# Patient Record
Sex: Male | Born: 1985 | Race: White | Hispanic: No | Marital: Single | Smoking: Never smoker
Health system: Southern US, Community
[De-identification: ages and names within clinical notes are randomized; demographics above are authoritative.]

## PROBLEM LIST (undated history)

## (undated) DIAGNOSIS — Z789 Other specified health status: Secondary | ICD-10-CM

## (undated) DIAGNOSIS — F419 Anxiety disorder, unspecified: Secondary | ICD-10-CM

## (undated) DIAGNOSIS — F4310 Post-traumatic stress disorder, unspecified: Secondary | ICD-10-CM

## (undated) DIAGNOSIS — F319 Bipolar disorder, unspecified: Secondary | ICD-10-CM

## (undated) DIAGNOSIS — N189 Chronic kidney disease, unspecified: Secondary | ICD-10-CM

## (undated) DIAGNOSIS — Z0282 Encounter for adoption services: Secondary | ICD-10-CM

## (undated) HISTORY — PX: SHOULDER SURGERY: SHX246

## (undated) HISTORY — PX: TONSILLECTOMY: SUR1361

## (undated) HISTORY — PX: KNEE SURGERY: SHX244

## (undated) HISTORY — PX: EYE SURGERY: SHX253

---

## 2013-06-03 ENCOUNTER — Emergency Department: Payer: Self-pay | Admitting: Emergency Medicine

## 2013-10-24 ENCOUNTER — Emergency Department: Payer: Self-pay | Admitting: Emergency Medicine

## 2014-12-30 ENCOUNTER — Ambulatory Visit
Admission: EM | Admit: 2014-12-30 | Discharge: 2014-12-30 | Disposition: A | Discharge status: Home / Self Care | Payer: BLUE CROSS/BLUE SHIELD | Attending: Family Medicine | Admitting: Family Medicine

## 2014-12-30 ENCOUNTER — Ambulatory Visit: Payer: BLUE CROSS/BLUE SHIELD

## 2014-12-30 DIAGNOSIS — M545 Low back pain, unspecified: Secondary | ICD-10-CM

## 2014-12-30 MED ORDER — KETOROLAC TROMETHAMINE 60 MG/2ML IM SOLN
60.0000 mg | Freq: Once | INTRAMUSCULAR | Status: AC
  Administered 2014-12-30: 60 mg via INTRAMUSCULAR

## 2014-12-30 MED ORDER — TRAMADOL HCL 50 MG PO TABS: 50.0000 mg | ORAL_TABLET | Freq: Three times a day (TID) | ORAL | Status: DC | PRN

## 2014-12-30 MED ORDER — MELOXICAM 15 MG PO TABS: 15.0000 mg | ORAL_TABLET | Freq: Every day | ORAL | Status: DC | PRN

## 2014-12-30 NOTE — ED Notes (Signed)
Pt states "I was moving my mom's furniture and dead lifted a 200lb freezer. On Sunday night I heard a pop and it's been bad pain every since."

## 2014-12-30 NOTE — ED Provider Notes (Signed)
[facility] Emergency Department Provider Note  ____________________________________________  Time seen: Approximately 2:46 PM  I have reviewed the triage vital signs and the nursing notes.   HISTORY  Chief Complaint Back Pain   HPI Thomas Peters is a 29 y.o. male presents for complaints of low back pain. Patient reports back pain has been present since Sunday. Patient reports that Sunday evening he was helping move his mother's furniture. Patient states that he had to lift a 200 pound freezer up off of the ground so they could get the dolly underneath the freezer. Patient states that he bent down to lift the freezer and states as soon as he started to the lift he started to feel pain in his low back. Patient states the pain is been present since. Denies fall or direct trauma. Denies other pain or injury. Reports continues ambulate well however with pain primarily with movement. States that if he lies down and sits still the pain greatly improved however with movement the pain returns. Patient states pain currently is 5 out of 10 however 8 out of 10 with movement. States bending and twisting pain hurts.   Patient denies numbness or tingling sensation. Denies urinary or bowel retention or incontinence. Denies pain radiation. Denies pain radiation into the legs. Reports continues to ambulate well. Denies recent sickness. Denies neck or upper back pain. Denies abdominal pain, chest pain, shortness of breath, fever, nausea, vomiting, diarrhea or constipation. Patient states that he has a primary care appointment for the same tomorrow. States his primary care physician is Dr[facility]. States however he has to frequently move around at work and his pain was interfering his job and his boss encouraged him to be seen today.States does not have to lift objects at work but does walk and move frequently.    History reviewed. No pertinent past medical history.   right rotator cuff injury.  There are no active problems to display for this patient.   Past Surgical History  Procedure Laterality Date  . Knee surgery    . Eye surgery      Current Outpatient Rx  Name  Route  Sig  Dispense  Refill  .           Marland Kitchen.             Allergies Review of patient's allergies indicates no known allergies.  No family history on file. Reports unknown family history.  Reports adopted at 904 months old.    Social History Social History  Substance Use Topics  . Smoking status: Never Smoker   . Smokeless tobacco: None  . Alcohol Use: No    Review of Systems Constitutional: No fever/chills Eyes: No visual changes. ENT: No sore throat. Cardiovascular: Denies chest pain. Respiratory: Denies shortness of breath. Gastrointestinal: No abdominal pain.  No nausea, no vomiting.  No diarrhea.  No constipation. Genitourinary: Negative for dysuria. Musculoskeletal: positive for back pain. Skin: Negative for rash. Neurological: Negative for headaches, focal weakness or numbness.  10-point ROS otherwise negative.  ____________________________________________   PHYSICAL EXAM:  VITAL SIGNS: ED Triage Vitals  Enc Vitals Group     BP 12/30/14 1400 132/79 mmHg     Pulse Rate 12/30/14 1400 80     Resp 12/30/14 1400 18     Temp 12/30/14 1400 97.5 F (36.4 C)     Temp Source 12/30/14 1400 Tympanic     SpO2 12/30/14 1400 99 %     Weight  12/30/14 1400 287 lb (130.182 kg)     Height 12/30/14 1400  (1.778 m)     Head Cir --      Peak Flow --      Pain Score 12/30/14 1359 8     Pain Loc --      Pain Edu? --      Excl. in GC? --    Constitutional: Alert and oriented. Well appearing and in no acute distress. Eyes: Conjunctivae are normal. PERRL. EOMI. Head: Atraumatic.  Nose: No congestion/rhinnorhea.  Mouth/Throat: Mucous membranes are moist.  Oropharynx non-erythematous. Neck: No stridor.  No cervical spine tenderness to  palpation. Hematological/Lymphatic/Immunilogical: No cervical lymphadenopathy. Cardiovascular: Normal rate, regular rhythm. Grossly normal heart sounds.  Good peripheral circulation. Respiratory: Normal respiratory effort.  No retractions. Lungs CTAB. Gastrointestinal: Soft and nontender. No distention. Normal Bowel sounds. No CVA tenderness. Musculoskeletal: No lower or upper extremity tenderness nor edema.  No joint effusions. Bilateral pedal pulses equal and easily palpated.  No cervical or thoracic TTP.  Lower lumbar mild TTP midline and mild paralumbar TTP. Steady gait. No mass, bulging, ecchymosis or erythema present. Right straight leg test negative, mild pain with left straight leg test at approximately 45 degrees. No saddle anesthesia. Changes positions from lying to standing quickly without distress or difficulty. Steady gait. Lumbar pain with flexion and right and left rotation. Strong plantar and dorsiflexion.  Neurologic:  Normal speech and language. No gross focal neurologic deficits are appreciated. No gait instability. Skin:  Skin is warm, dry and intact. No rash noted. Psychiatric: Mood and affect are normal. Speech and behavior are normal.  ____________________________________________   LABS (all labs ordered are listed, but only abnormal results are displayed)  Labs Reviewed - No data to display  RADIOLOGY  EXAM: LUMBAR SPINE - COMPLETE 4+ VIEW  COMPARISON: October 24, 2013  FINDINGS: Frontal, lateral, spot lumbosacral lateral, and bilateral oblique views were obtained. There are 5 non-rib-bearing lumbar type vertebral bodies. There is no fracture or spondylolisthesis. Disc spaces appear intact. There is no appreciable facet arthropathy. There is spina bifida occulta at S1.  IMPRESSION: No fracture or spondylolisthesis. No appreciable arthropathy. Incidental note is made of spina bifida occulta at S1.   Electronically Signed By: Bretta Bang III  M.D. On: 12/30/2014 15:18      I, Renford Dills, personally viewed and evaluated these images (plain radiographs) as part of my medical decision making.    ____________________________________________    INITIAL IMPRESSION / ASSESSMENT AND PLAN / ED COURSE  Pertinent labs & imaging results that were available during my care of the patient were reviewed by me and considered in my medical decision making (see chart for details).  Very well-appearing patient. No acute distress. Presents for the complaint of low back pain post lifting injury. Denies direct trauma or fall. Denies head injury or loss of consciousness. Patient with lumbar and paralumbar tenderness to palpation, pain increases with bending and twisting movements. Steady gait. Changes positions from lying to standing in room quickly without distress. No saddle anesthesia. No neurological deficits noted. Suspect lumbosacral strain. Will evaluate x-ray as well as 60 mg IM Toradol.  Will treat prn tramadol and mobic. Patient to follow-up with his primary care physician tomorrow as scheduled. Discussed with patient for continued back pain he is to follow-up with his primary care physician as an outpatient MRI to evaluate for disc injury.Discussed follow up with Primary care physician this week. Discussed follow up and return parameters including no  resolution or any worsening concerns. Patient verbalized understanding and agreed to plan.   ____________________________________________   FINAL CLINICAL IMPRESSION(S) / ED DIAGNOSES  Final diagnoses:  Midline low back pain without sciatica       Renford Dills, NP 12/30/14 1608  Renford Dills, NP 12/30/14 1609

## 2014-12-30 NOTE — Discharge Instructions (Signed)
Take medication as prescribed. Rest. Apply ice. Avoid strenuous activity.   Follow up with your primary care physician tomorrow as scheduled.   Return to Urgent care or go ER for increased pain, numbness, sensation changes, urinary or bowel changes, new or worsening concerns.   Back Pain, Adult Back pain is very common in adults.The cause of back pain is rarely dangerous and the pain often gets better over time.The cause of your back pain may not be known. Some common causes of back pain include: 1. Strain of the muscles or ligaments supporting the spine. 2. Wear and tear (degeneration) of the spinal disks. 3. Arthritis. 4. Direct injury to the back. For many people, back pain may return. Since back pain is rarely dangerous, most people can learn to manage this condition on their own. HOME CARE INSTRUCTIONS Watch your back pain for any changes. The following actions may help to lessen any discomfort you are feeling: 1. Remain active. It is stressful on your back to sit or stand in one place for long periods of time. Do not sit, drive, or stand in one place for more than 30 minutes at a time. Take short walks on even surfaces as soon as you are able.Try to increase the length of time you walk each day. 2. Exercise regularly as directed by your health care provider. Exercise helps your back heal faster. It also helps avoid future injury by keeping your muscles strong and flexible. 3. Do not stay in bed.Resting more than 1-2 days can delay your recovery. 4. Pay attention to your body when you bend and lift. The most comfortable positions are those that put less stress on your recovering back. Always use proper lifting techniques, including: 1. Bending your knees. 2. Keeping the load close to your body. 3. Avoiding twisting. 5. Find a comfortable position to sleep. Use a firm mattress and lie on your side with your knees slightly bent. If you lie on your back, put a pillow under your  knees. 6. Avoid feeling anxious or stressed.Stress increases muscle tension and can worsen back pain.It is important to recognize when you are anxious or stressed and learn ways to manage it, such as with exercise. 7. Take medicines only as directed by your health care provider. Over-the-counter medicines to reduce pain and inflammation are often the most helpful.Your health care provider may prescribe muscle relaxant drugs.These medicines help dull your pain so you can more quickly return to your normal activities and healthy exercise. 8. Apply ice to the injured area: 1. Put ice in a plastic bag. 2. Place a towel between your skin and the bag. 3. Leave the ice on for 20 minutes, 2-3 times a day for the first 2-3 days. After that, ice and heat may be alternated to reduce pain and spasms. 9. Maintain a healthy weight. Excess weight puts extra stress on your back and makes it difficult to maintain good posture. SEEK MEDICAL CARE IF: 1. You have pain that is not relieved with rest or medicine. 2. You have increasing pain going down into the legs or buttocks. 3. You have pain that does not improve in one week. 4. You have night pain. 5. You lose weight. 6. You have a fever or chills. SEEK IMMEDIATE MEDICAL CARE IF:  1. You develop new bowel or bladder control problems. 2. You have unusual weakness or numbness in your arms or legs. 3. You develop nausea or vomiting. 4. You develop abdominal pain. 5. You feel faint.  This information is not intended to replace advice given to you by your health care provider. Make sure you discuss any questions you have with your health care provider.   Document Released: 02/21/2005 Document Revised: 03/14/2014 Document Reviewed: 06/25/2013 Elsevier Interactive Patient Education 2016 [employer]  Back Exercises The following exercises strengthen the muscles that help to support the back. They also help to keep the lower back flexible. Doing these  exercises can help to prevent back pain or lessen existing pain. If you have back pain or discomfort, try doing these exercises 2-3 times each day or as told by your health care provider. When the pain goes away, do them once each day, but increase the number of times that you repeat the steps for each exercise (do more repetitions). If you do not have back pain or discomfort, do these exercises once each day or as told by your health care provider. EXERCISES Single Knee to Chest Repeat these steps 3-5 times for each leg: 5. Lie on your back on a firm bed or the floor with your legs extended. 6. Bring one knee to your chest. Your other leg should stay extended and in contact with the floor. 7. Hold your knee in place by grabbing your knee or thigh. 8. Pull on your knee until you feel a gentle stretch in your lower back. 9. Hold the stretch for 10-30 seconds. 10. Slowly release and straighten your leg. Pelvic Tilt Repeat these steps 5-10 times: 10. Lie on your back on a firm bed or the floor with your legs extended. 11. Bend your knees so they are pointing toward the ceiling and your feet are flat on the floor. 12. Tighten your lower abdominal muscles to press your lower back against the floor. This motion will tilt your pelvis so your tailbone points up toward the ceiling instead of pointing to your feet or the floor. 13. With gentle tension and even breathing, hold this position for 5-10 seconds. Cat-Cow Repeat these steps until your lower back becomes more flexible: 7. Get into a hands-and-knees position on a firm surface. Keep your hands under your shoulders, and keep your knees under your hips. You may place padding under your knees for comfort. 8. Let your head hang down, and point your tailbone toward the floor so your lower back becomes rounded like the back of a cat. 9. Hold this position for 5 seconds. 10. Slowly lift your head and point your tailbone up toward the ceiling so your back  forms a sagging arch like the back of a cow. 11. Hold this position for 5 seconds. Press-Ups Repeat these steps 5-10 times: 6. Lie on your abdomen (face-down) on the floor. 7. Place your palms near your head, about shoulder-width apart. 8. While you keep your back as relaxed as possible and keep your hips on the floor, slowly straighten your arms to raise the top half of your body and lift your shoulders. Do not use your back muscles to raise your upper torso. You may adjust the placement of your hands to make yourself more comfortable. 9. Hold this position for 5 seconds while you keep your back relaxed. 10. Slowly return to lying flat on the floor. Bridges Repeat these steps 10 times: 1. Lie on your back on a firm surface. 2. Bend your knees so they are pointing toward the ceiling and your feet are flat on the floor. 3. Tighten your buttocks muscles and lift your buttocks off of the floor until  your waist is at almost the same height as your knees. You should feel the muscles working in your buttocks and the back of your thighs. If you do not feel these muscles, slide your feet 1-2 inches farther away from your buttocks. 4. Hold this position for 3-5 seconds. 5. Slowly lower your hips to the starting position, and allow your buttocks muscles to relax completely. If this exercise is too easy, try doing it with your arms crossed over your chest. Abdominal Crunches Repeat these steps 5-10 times: 1. Lie on your back on a firm bed or the floor with your legs extended. 2. Bend your knees so they are pointing toward the ceiling and your feet are flat on the floor. 3. Cross your arms over your chest. 4. Tip your chin slightly toward your chest without bending your neck. 5. Tighten your abdominal muscles and slowly raise your trunk (torso) high enough to lift your shoulder blades a tiny bit off of the floor. Avoid raising your torso higher than that, because it can put too much stress on your low  back and it does not help to strengthen your abdominal muscles. 6. Slowly return to your starting position. Back Lifts Repeat these steps 5-10 times: 1. Lie on your abdomen (face-down) with your arms at your sides, and rest your forehead on the floor. 2. Tighten the muscles in your legs and your buttocks. 3. Slowly lift your chest off of the floor while you keep your hips pressed to the floor. Keep the back of your head in line with the curve in your back. Your eyes should be looking at the floor. 4. Hold this position for 3-5 seconds. 5. Slowly return to your starting position. SEEK MEDICAL CARE IF:  Your back pain or discomfort gets much worse when you do an exercise.  Your back pain or discomfort does not lessen within 2 hours after you exercise. If you have any of these problems, stop doing these exercises right away. Do not do them again unless your health care provider says that you can. SEEK IMMEDIATE MEDICAL CARE IF:  You develop sudden, severe back pain. If this happens, stop doing the exercises right away. Do not do them again unless your health care provider says that you can.   This information is not intended to replace advice given to you by your health care provider. Make sure you discuss any questions you have with your health care provider.   Document Released: 03/31/2004 Document Revised: 11/12/2014 Document Reviewed: 04/17/2014 Elsevier Interactive Patient Education [employer]

## 2014-12-31 ENCOUNTER — Other Ambulatory Visit: Payer: Self-pay | Admitting: Family Medicine

## 2014-12-31 DIAGNOSIS — Q760 Spina bifida occulta: Secondary | ICD-10-CM

## 2014-12-31 DIAGNOSIS — M545 Low back pain, unspecified: Secondary | ICD-10-CM

## 2015-01-13 ENCOUNTER — Ambulatory Visit
Admission: RE | Admit: 2015-01-13 | Discharge: 2015-01-13 | Disposition: A | Discharge status: Home / Self Care | Payer: BLUE CROSS/BLUE SHIELD | Source: Ambulatory Visit | Attending: Family Medicine | Admitting: Family Medicine

## 2015-01-13 DIAGNOSIS — Q760 Spina bifida occulta: Secondary | ICD-10-CM | POA: Insufficient documentation

## 2015-01-13 DIAGNOSIS — M545 Low back pain, unspecified: Secondary | ICD-10-CM

## 2015-03-30 ENCOUNTER — Other Ambulatory Visit: Payer: Self-pay | Admitting: Orthopedic Surgery

## 2015-03-30 DIAGNOSIS — M25311 Other instability, right shoulder: Secondary | ICD-10-CM

## 2015-03-30 DIAGNOSIS — Z9889 Other specified postprocedural states: Secondary | ICD-10-CM

## 2015-03-30 DIAGNOSIS — M25511 Pain in right shoulder: Secondary | ICD-10-CM

## 2015-04-17 ENCOUNTER — Ambulatory Visit
Admission: RE | Admit: 2015-04-17 | Discharge: 2015-04-17 | Disposition: A | Discharge status: Home / Self Care | Payer: BLUE CROSS/BLUE SHIELD | Source: Ambulatory Visit | Attending: Orthopedic Surgery | Admitting: Orthopedic Surgery

## 2015-04-17 DIAGNOSIS — M25311 Other instability, right shoulder: Secondary | ICD-10-CM

## 2015-04-17 DIAGNOSIS — M25511 Pain in right shoulder: Secondary | ICD-10-CM

## 2015-04-17 DIAGNOSIS — Z9889 Other specified postprocedural states: Secondary | ICD-10-CM

## 2015-04-29 ENCOUNTER — Ambulatory Visit
Admission: RE | Admit: 2015-04-29 | Discharge: 2015-04-29 | Disposition: A | Discharge status: Home / Self Care | Payer: BLUE CROSS/BLUE SHIELD | Source: Ambulatory Visit | Attending: Orthopedic Surgery | Admitting: Orthopedic Surgery

## 2015-04-29 DIAGNOSIS — M25511 Pain in right shoulder: Secondary | ICD-10-CM | POA: Insufficient documentation

## 2015-04-29 DIAGNOSIS — M75111 Incomplete rotator cuff tear or rupture of right shoulder, not specified as traumatic: Secondary | ICD-10-CM | POA: Insufficient documentation

## 2015-04-29 DIAGNOSIS — Z9889 Other specified postprocedural states: Secondary | ICD-10-CM | POA: Diagnosis present

## 2015-04-29 DIAGNOSIS — M25311 Other instability, right shoulder: Secondary | ICD-10-CM | POA: Insufficient documentation

## 2015-04-29 DIAGNOSIS — M62511 Muscle wasting and atrophy, not elsewhere classified, right shoulder: Secondary | ICD-10-CM | POA: Diagnosis not present

## 2015-06-22 ENCOUNTER — Encounter: Payer: Self-pay | Admitting: *Deleted

## 2015-06-22 ENCOUNTER — Other Ambulatory Visit: Payer: BLUE CROSS/BLUE SHIELD

## 2015-06-22 NOTE — Patient Instructions (Signed)
  Your procedure is scheduled on: 06-25-15 (THURSDAY) Report to [facility] 2ND FLOOR To find out your arrival time please call [phone] between 1PM - 3PM on 06-24-15 [facility](WEDNESDAY)  Remember: Instructions that are not followed completely may result in serious medical risk, up to and including death, or upon the discretion of your surgeon and anesthesiologist your surgery may need to be rescheduled.    _X___ 1. Do not eat food or drink liquids after midnight. No gum chewing or hard candies.     _X___ 2. No Alcohol for 24 hours before or after surgery.   ____ 3. Bring all medications with you on the day of surgery if instructed.    _X___ 4. Notify your doctor if there is any change in your medical condition     (cold, fever, infections).     Do not wear jewelry, make-up, hairpins, clips or nail polish.  Do not wear lotions, powders, or perfumes. You may wear deodorant.  Do not shave 48 hours prior to surgery. Men may shave face and neck.  Do not bring valuables to the hospital.    [facility] is not responsible for any belongings or valuables.               Contacts, dentures or bridgework may not be worn into surgery.  Leave your suitcase in the car. After surgery it may be brought to your room.  For patients admitted to the hospital, discharge time is determined by your treatment team.   Patients discharged the day of surgery will not be allowed to drive home.   Please read over the following fact sheets that you were given:    _X___ Take these medicines the morning of surgery with A SIP OF WATER:    1. LEXAPRO  2.   3.   4.  5.  6.  ____ Fleet Enema (as directed)   _X___ Use CHG Soap as directed  ____ Use inhalers on the day of surgery  ____ Stop metformin 2 days prior to surgery    ____ Take 1/2 of usual insulin dose the night before surgery and none on the morning of surgery.   ____ Stop Coumadin/Plavix/aspirin-N/A  ____ Stop  Anti-inflammatories-NO NSAIDS OR ASPIRIN PRODUCTS-TRAMADOL OK TO CONTINUE OR TYLENOL    ____ Stop supplements until after surgery.    ____ Bring C-Pap to the hospital.

## 2015-06-23 ENCOUNTER — Encounter
Admission: RE | Admit: 2015-06-23 | Discharge: 2015-06-23 | Disposition: A | Discharge status: Home / Self Care | Payer: BLUE CROSS/BLUE SHIELD | Source: Ambulatory Visit | Attending: Surgery | Admitting: Surgery

## 2015-06-23 DIAGNOSIS — S46811A Strain of other muscles, fascia and tendons at shoulder and upper arm level, right arm, initial encounter: Secondary | ICD-10-CM | POA: Diagnosis not present

## 2015-06-23 DIAGNOSIS — F4310 Post-traumatic stress disorder, unspecified: Secondary | ICD-10-CM | POA: Diagnosis not present

## 2015-06-23 DIAGNOSIS — Z9109 Other allergy status, other than to drugs and biological substances: Secondary | ICD-10-CM | POA: Diagnosis not present

## 2015-06-23 DIAGNOSIS — N189 Chronic kidney disease, unspecified: Secondary | ICD-10-CM | POA: Diagnosis not present

## 2015-06-23 DIAGNOSIS — Z79899 Other long term (current) drug therapy: Secondary | ICD-10-CM | POA: Diagnosis not present

## 2015-06-23 DIAGNOSIS — Z91030 Bee allergy status: Secondary | ICD-10-CM | POA: Diagnosis not present

## 2015-06-23 DIAGNOSIS — F319 Bipolar disorder, unspecified: Secondary | ICD-10-CM | POA: Diagnosis not present

## 2015-06-23 DIAGNOSIS — X58XXXA Exposure to other specified factors, initial encounter: Secondary | ICD-10-CM | POA: Diagnosis not present

## 2015-06-23 DIAGNOSIS — F419 Anxiety disorder, unspecified: Secondary | ICD-10-CM | POA: Diagnosis not present

## 2015-06-23 LAB — BASIC METABOLIC PANEL
Anion gap: 5 (ref 5–15)
BUN: 13 mg/dL (ref 6–20)
CALCIUM: 9.2 mg/dL (ref 8.9–10.3)
CO2: 25 mmol/L (ref 22–32)
CREATININE: 0.94 mg/dL (ref 0.61–1.24)
Chloride: 108 mmol/L (ref 101–111)
GFR calc non Af Amer: >60 mL/min (ref >60)
Glucose, Bld: 110 mg/dL — ABNORMAL HIGH (ref 65–99)
Potassium: 3.9 mmol/L (ref 3.5–5.1)
SODIUM: 138 mmol/L (ref 135–145)

## 2015-06-24 ENCOUNTER — Encounter: Payer: Self-pay | Admitting: *Deleted

## 2015-06-24 NOTE — Pre-Procedure Instructions (Signed)
Note on chart from pt's Nephrologist, Dr Ermalene Searingolluru. Note and labs enclosed from 03-2015.
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
## 2015-06-25 ENCOUNTER — Ambulatory Visit: Payer: BLUE CROSS/BLUE SHIELD | Admitting: Anesthesiology

## 2015-06-25 ENCOUNTER — Encounter: Payer: Self-pay | Admitting: Anesthesiology

## 2015-06-25 ENCOUNTER — Ambulatory Visit
Admission: RE | Admit: 2015-06-25 | Discharge: 2015-06-25 | Disposition: A | Discharge status: Home / Self Care | Payer: BLUE CROSS/BLUE SHIELD | Source: Ambulatory Visit | Attending: Surgery | Admitting: Surgery

## 2015-06-25 ENCOUNTER — Encounter
Admission: RE | Disposition: A | Discharge status: Home / Self Care | Payer: Self-pay | Source: Ambulatory Visit | Attending: Surgery

## 2015-06-25 DIAGNOSIS — F419 Anxiety disorder, unspecified: Secondary | ICD-10-CM | POA: Insufficient documentation

## 2015-06-25 DIAGNOSIS — Z79899 Other long term (current) drug therapy: Secondary | ICD-10-CM | POA: Insufficient documentation

## 2015-06-25 DIAGNOSIS — S46811A Strain of other muscles, fascia and tendons at shoulder and upper arm level, right arm, initial encounter: Secondary | ICD-10-CM | POA: Diagnosis not present

## 2015-06-25 DIAGNOSIS — Z9109 Other allergy status, other than to drugs and biological substances: Secondary | ICD-10-CM | POA: Insufficient documentation

## 2015-06-25 DIAGNOSIS — X58XXXA Exposure to other specified factors, initial encounter: Secondary | ICD-10-CM | POA: Insufficient documentation

## 2015-06-25 DIAGNOSIS — N189 Chronic kidney disease, unspecified: Secondary | ICD-10-CM | POA: Insufficient documentation

## 2015-06-25 DIAGNOSIS — F4310 Post-traumatic stress disorder, unspecified: Secondary | ICD-10-CM | POA: Insufficient documentation

## 2015-06-25 DIAGNOSIS — Z91030 Bee allergy status: Secondary | ICD-10-CM | POA: Insufficient documentation

## 2015-06-25 DIAGNOSIS — F319 Bipolar disorder, unspecified: Secondary | ICD-10-CM | POA: Insufficient documentation

## 2015-06-25 HISTORY — DX: Anxiety disorder, unspecified: F41.9

## 2015-06-25 HISTORY — DX: Encounter for adoption services: Z02.82

## 2015-06-25 HISTORY — DX: Chronic kidney disease, unspecified: N18.9

## 2015-06-25 HISTORY — DX: Post-traumatic stress disorder, unspecified: F43.10

## 2015-06-25 HISTORY — DX: Bipolar disorder, unspecified: F31.9

## 2015-06-25 HISTORY — DX: Other specified health status: Z78.9

## 2015-06-25 HISTORY — PX: SHOULDER ARTHROSCOPY WITH OPEN ROTATOR CUFF REPAIR: SHX6092

## 2015-06-25 SURGERY — ARTHROSCOPY, SHOULDER WITH REPAIR, ROTATOR CUFF, OPEN
Anesthesia: General | Site: Shoulder | Laterality: Right | Wound class: Clean

## 2015-06-25 MED ORDER — MIDAZOLAM HCL 5 MG/5ML IJ SOLN
2.0000 mg | Freq: Once | INTRAMUSCULAR | Status: AC
  Administered 2015-06-25: 2 mg via INTRAVENOUS

## 2015-06-25 MED ORDER — SUGAMMADEX SODIUM 200 MG/2ML IV SOLN
INTRAVENOUS | Status: DC | PRN
  Administered 2015-06-25: 273 mg via INTRAVENOUS

## 2015-06-25 MED ORDER — ONDANSETRON HCL 4 MG/2ML IJ SOLN: 4.0000 mg | Freq: Four times a day (QID) | INTRAMUSCULAR | Status: DC | PRN

## 2015-06-25 MED ORDER — OXYCODONE HCL 5 MG PO TABS: 5.0000 mg | ORAL_TABLET | ORAL | Status: DC | PRN

## 2015-06-25 MED ORDER — OXYCODONE HCL 5 MG/5ML PO SOLN: 5.0000 mg | Freq: Once | ORAL | Status: DC | PRN

## 2015-06-25 MED ORDER — OXYCODONE HCL 5 MG PO TABS: 5.0000 mg | ORAL_TABLET | Freq: Once | ORAL | Status: DC | PRN

## 2015-06-25 MED ORDER — FAMOTIDINE 20 MG PO TABS
20.0000 mg | ORAL_TABLET | Freq: Once | ORAL | Status: AC
  Administered 2015-06-25: 20 mg via ORAL

## 2015-06-25 MED ORDER — MIDAZOLAM HCL 5 MG/5ML IJ SOLN
INTRAMUSCULAR | Status: AC
  Administered 2015-06-25: 2 mg via INTRAVENOUS
  Filled 2015-06-25: qty 5

## 2015-06-25 MED ORDER — PROPOFOL 10 MG/ML IV BOLUS
INTRAVENOUS | Status: DC | PRN
  Administered 2015-06-25: 260 mg via INTRAVENOUS

## 2015-06-25 MED ORDER — EPINEPHRINE HCL 1 MG/ML IJ SOLN
INTRAMUSCULAR | Status: AC
  Filled 2015-06-25: qty 1

## 2015-06-25 MED ORDER — LIDOCAINE HCL (PF) 1 % IJ SOLN
INTRAMUSCULAR | Status: AC
  Administered 2015-06-25: 1 mL via INTRADERMAL
  Filled 2015-06-25: qty 5

## 2015-06-25 MED ORDER — METOCLOPRAMIDE HCL 5 MG/ML IJ SOLN: 5.0000 mg | Freq: Three times a day (TID) | INTRAMUSCULAR | Status: DC | PRN

## 2015-06-25 MED ORDER — METOCLOPRAMIDE HCL 10 MG PO TABS: 5.0000 mg | ORAL_TABLET | Freq: Three times a day (TID) | ORAL | Status: DC | PRN

## 2015-06-25 MED ORDER — FAMOTIDINE 20 MG PO TABS
ORAL_TABLET | ORAL | Status: AC
  Administered 2015-06-25: 20 mg via ORAL
  Filled 2015-06-25: qty 1

## 2015-06-25 MED ORDER — FENTANYL CITRATE (PF) 100 MCG/2ML IJ SOLN
INTRAMUSCULAR | Status: AC
  Administered 2015-06-25: 50 ug via INTRAVENOUS
  Filled 2015-06-25: qty 2

## 2015-06-25 MED ORDER — FENTANYL CITRATE (PF) 100 MCG/2ML IJ SOLN
50.0000 ug | Freq: Once | INTRAMUSCULAR | Status: AC
  Administered 2015-06-25: 50 ug via INTRAVENOUS

## 2015-06-25 MED ORDER — ROCURONIUM BROMIDE 100 MG/10ML IV SOLN
INTRAVENOUS | Status: DC | PRN
  Administered 2015-06-25: 20 mg via INTRAVENOUS
  Administered 2015-06-25: 10 mg via INTRAVENOUS
  Administered 2015-06-25: 30 mg via INTRAVENOUS
  Administered 2015-06-25: 10 mg via INTRAVENOUS
  Administered 2015-06-25: 20 mg via INTRAVENOUS

## 2015-06-25 MED ORDER — BUPIVACAINE-EPINEPHRINE (PF) 0.5% -1:200000 IJ SOLN
INTRAMUSCULAR | Status: AC
  Filled 2015-06-25: qty 30

## 2015-06-25 MED ORDER — BUPIVACAINE-EPINEPHRINE 0.5% -1:200000 IJ SOLN
INTRAMUSCULAR | Status: DC | PRN
  Administered 2015-06-25: 20 mL

## 2015-06-25 MED ORDER — SUCCINYLCHOLINE CHLORIDE 20 MG/ML IJ SOLN
INTRAMUSCULAR | Status: DC | PRN
  Administered 2015-06-25: 140 mg via INTRAVENOUS

## 2015-06-25 MED ORDER — ONDANSETRON HCL 4 MG/2ML IJ SOLN
INTRAMUSCULAR | Status: DC | PRN
  Administered 2015-06-25: 4 mg via INTRAVENOUS

## 2015-06-25 MED ORDER — LACTATED RINGERS IV SOLN: INTRAVENOUS | Status: DC | PRN

## 2015-06-25 MED ORDER — ONDANSETRON HCL 4 MG PO TABS: 4.0000 mg | ORAL_TABLET | Freq: Four times a day (QID) | ORAL | Status: DC | PRN

## 2015-06-25 MED ORDER — LACTATED RINGERS IV SOLN
INTRAVENOUS | Status: DC
  Administered 2015-06-25 (×2): via INTRAVENOUS

## 2015-06-25 MED ORDER — CEFAZOLIN SODIUM 10 G IJ SOLR
3.0000 g | Freq: Once | INTRAMUSCULAR | Status: DC
  Filled 2015-06-25: qty 3000

## 2015-06-25 MED ORDER — ROPIVACAINE HCL 5 MG/ML IJ SOLN
INTRAMUSCULAR | Status: AC
  Administered 2015-06-25 (×3): 10 mL via PERINEURAL
  Filled 2015-06-25: qty 40

## 2015-06-25 MED ORDER — EPINEPHRINE HCL 1 MG/ML IJ SOLN
INTRAMUSCULAR | Status: DC | PRN
  Administered 2015-06-25: 2 mL

## 2015-06-25 MED ORDER — FENTANYL CITRATE (PF) 100 MCG/2ML IJ SOLN: 25.0000 ug | INTRAMUSCULAR | Status: DC | PRN

## 2015-06-25 SURGICAL SUPPLY — 50 items
ANCHOR SUT BIO SW 4.75X19.1 (Anchor) ×6 IMPLANT
BIT DRILL JUGRKNT W/NDL BIT2.9 (DRILL) IMPLANT
BLADE FULL RADIUS 3.5 (BLADE) ×3 IMPLANT
BLADE SHAVER 4.5X7 STR FR (MISCELLANEOUS) IMPLANT
BUR ACROMIONIZER 4.0 (BURR) ×3 IMPLANT
BUR BR 5.5 WIDE MOUTH (BURR) IMPLANT
CANNULA 8.5X75 THRED (CANNULA) ×3 IMPLANT
CANNULA SHAVER 8MMX76MM (CANNULA) IMPLANT
CHLORAPREP W/TINT 26ML (MISCELLANEOUS) ×6 IMPLANT
COVER MAYO STAND STRL (DRAPES) ×3 IMPLANT
DRAPE IMP U-DRAPE 54X76 (DRAPES) ×3 IMPLANT
DRAPE SURG 17X11 SM STRL (DRAPES) ×3 IMPLANT
DRILL JUGGERKNOT W/NDL BIT 2.9 (DRILL)
DRSG OPSITE POSTOP 4X8 (GAUZE/BANDAGES/DRESSINGS) ×3 IMPLANT
ELECT REM PT RETURN 9FT ADLT (ELECTROSURGICAL) ×3
ELECTRODE REM PT RTRN 9FT ADLT (ELECTROSURGICAL) ×1 IMPLANT
GAUZE PETRO XEROFOAM 1X8 (MISCELLANEOUS) ×3 IMPLANT
GAUZE SPONGE 4X4 12PLY STRL (GAUZE/BANDAGES/DRESSINGS) IMPLANT
GLOVE BIO SURGEON STRL SZ7.5 (GLOVE) IMPLANT
GLOVE BIO SURGEON STRL SZ8 (GLOVE) ×6 IMPLANT
GLOVE BIOGEL PI IND STRL 8 (GLOVE) ×1 IMPLANT
GLOVE BIOGEL PI INDICATOR 8 (GLOVE) ×2
GLOVE INDICATOR 8.0 STRL GRN (GLOVE) ×3 IMPLANT
GOWN STRL REUS W/ TWL LRG LVL3 (GOWN DISPOSABLE) ×2 IMPLANT
GOWN STRL REUS W/ TWL XL LVL3 (GOWN DISPOSABLE) ×1 IMPLANT
GOWN STRL REUS W/TWL LRG LVL3 (GOWN DISPOSABLE) ×4
GOWN STRL REUS W/TWL XL LVL3 (GOWN DISPOSABLE) ×2
GRASPER SUT 15 45D LOW PRO (SUTURE) IMPLANT
IV LACTATED RINGER IRRG 3000ML (IV SOLUTION) ×4
IV LR IRRIG 3000ML ARTHROMATIC (IV SOLUTION) ×2 IMPLANT
MANIFOLD NEPTUNE II (INSTRUMENTS) ×3 IMPLANT
MASK FACE SPIDER DISP (MASK) ×3 IMPLANT
MAT BLUE FLOOR 46X72 FLO (MISCELLANEOUS) ×3 IMPLANT
NEEDLE MAYO CATGUT SZ4 (NEEDLE) ×6 IMPLANT
NEEDLE REVERSE CUT 1/2 CRC (NEEDLE) IMPLANT
PACK ARTHROSCOPY SHOULDER (MISCELLANEOUS) ×3 IMPLANT
SLING ARM LRG DEEP (SOFTGOODS) IMPLANT
SLING ULTRA II LG (MISCELLANEOUS) ×3 IMPLANT
STAPLER SKIN PROX 35W (STAPLE) ×3 IMPLANT
STRAP SAFETY BODY (MISCELLANEOUS) ×3 IMPLANT
SUT ETHIBOND 0 MO6 C/R (SUTURE) ×3 IMPLANT
SUT PROLENE 4 0 PS 2 18 (SUTURE) IMPLANT
SUT TIGER TAPE 7 IN WHITE (SUTURE) ×6 IMPLANT
SUT VIC AB 2-0 CT1 27 (SUTURE) ×4
SUT VIC AB 2-0 CT1 TAPERPNT 27 (SUTURE) ×2 IMPLANT
TAPE MICROFOAM 4IN (TAPE) ×3 IMPLANT
TUBING ARTHRO INFLOW-ONLY STRL (TUBING) ×3 IMPLANT
TUBING CONNECTING 10 (TUBING) ×2 IMPLANT
TUBING CONNECTING 10' (TUBING) ×1
WAND HAND CNTRL MULTIVAC 90 (MISCELLANEOUS) ×3 IMPLANT

## 2015-06-25 NOTE — OR Nursing (Signed)
Pt states he has two screws in left knee

## 2015-06-25 NOTE — H&P (Signed)
Paper H&P to be scanned into permanent record. H&P reviewed. No changes. 
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
## 2015-06-25 NOTE — Anesthesia Procedure Notes (Addendum)
Anesthesia Regional Block:  Interscalene brachial plexus block  Pre-Anesthetic Checklist: ,, timeout performed, Correct Patient, Correct Site, Correct Laterality, Correct Procedure, Correct Position, site marked, Risks and benefits discussed,  Surgical consent,  Pre-op evaluation,  At surgeon's request and post-op pain management  Laterality: Upper and Right  Prep: chloraprep       Needles:  Injection technique: Single-shot  Needle Type: Stimiplex     Needle Length: 5cm 5 cm Needle Gauge: 22 and 22 G    Additional Needles:  Procedures: ultrasound guided (picture in chart) Interscalene brachial plexus block Narrative:  Start time: 06/25/2015 9:15 AM End time: 06/25/2015 9:17 AM Injection made incrementally with aspirations every 5 mL.  Performed by: Personally  Anesthesiologist: Margorie JohnPISCITELLO, Kingdom K  Additional Notes: Functioning IV was confirmed and monitors were applied.  A 50mm 22ga Stimuplex needle was used. Sterile prep,hand hygiene and sterile gloves were used.  Negative aspiration and negative test dose prior to incremental administration of local anesthetic. The patient tolerated the procedure well with no immediate complications.  Patient endorses baseline weakness from injury in right shoulder.   Procedure Name: Intubation Date/Time: 06/25/2015 10:05 AM Performed by: Edyth GunnelsGILBERT, Lanell Carpenter Pre-anesthesia Checklist: Patient identified, Emergency Drugs available, Suction available, Patient being monitored and Timeout performed Patient Re-evaluated:Patient Re-evaluated prior to inductionOxygen Delivery Method: Circle system utilized Preoxygenation: Pre-oxygenation with 100% oxygen Intubation Type: IV induction Ventilation: Mask ventilation without difficulty Laryngoscope Size: Mac and 4 Grade View: Grade II Tube type: Oral Tube size: 7.0 mm Number of attempts: 1 Airway Equipment and Method: Stylet Placement Confirmation: ETT inserted through vocal cords under direct  vision,  positive ETCO2 and breath sounds checked- equal and bilateral Secured at: 21 cm Tube secured with: Tape Dental Injury: Teeth and Oropharynx as per pre-operative assessment

## 2015-06-25 NOTE — Transfer of Care (Signed)
Immediate Anesthesia Transfer of Care Note  Patient: Thomas Peters  Procedure(s) Performed: Procedure(s): SHOULDER ARTHROSCOPY DEBRIDEMENT AND  OPEN SUBSCAP  REPAIR (Right)  Patient Location: PACU  Anesthesia Type:General  Level of Consciousness: awake, alert  and oriented  Airway & Oxygen Therapy: Patient Spontanous Breathing  Post-op Assessment: Report given to RN and Post -op Vital signs reviewed and stable  Post vital signs: Reviewed and stable  Last Vitals:  Filed Vitals:   06/25/15 0948 06/25/15 1221  BP:  121/93  Pulse: 76 102  Temp:  36.3 C  Resp: 20 17    Complications: No apparent anesthesia complications

## 2015-06-25 NOTE — Anesthesia Postprocedure Evaluation (Signed)
Anesthesia Post Note  Patient: Thomas Peters  Procedure(s) Performed: Procedure(s) (LRB): SHOULDER ARTHROSCOPY DEBRIDEMENT AND  OPEN SUBSCAP  REPAIR (Right)  Patient location during evaluation: PACU Anesthesia Type: General Level of consciousness: awake and alert Pain management: pain level controlled Vital Signs Assessment: post-procedure vital signs reviewed and stable Respiratory status: spontaneous breathing, nonlabored ventilation, respiratory function stable and patient connected to nasal cannula oxygen Cardiovascular status: blood pressure returned to baseline and stable Postop Assessment: no signs of nausea or vomiting Anesthetic complications: no    Last Vitals:  Filed Vitals:   06/25/15 1327 06/25/15 1406  BP: 140/85 138/78  Pulse: 85   Temp:    Resp:      Last Pain:  Filed Vitals:   06/25/15 1407  PainSc: 0-No pain                 Thomas Peters

## 2015-06-25 NOTE — Op Note (Signed)
06/25/2015  12:07 PM  Patient:   Thomas Peters  Pre-Op Diagnosis:   Subscapularis tendon tear, right shoulder.  Post-Op Diagnosis:   Same.  Procedure:   Right shoulder arthroscopy with limited debridement and open subscapularis tendon repair, right shoulder.  Surgeon:   Maryagnes Amos, MD  Assistant:   Cranston Neighbor, PA-C  Anesthesia:   General endotracheal intubation with an interscalene block.  Findings:   As above. The subscapularis tendon had torn at the muscle-tendon junction and retracted medially, leaving only the inferior 25% of the tendon. The remaining portions of the rotator cuff or in excellent condition, as were the articular surfaces of the glenoid and humerus. The biceps tendon also was in excellent condition.  Complications:   None  EBL:   2000 cc  Fluids:   25 cc crystalloid  TT:   None  Drains:   None  Closure:   Staples  Brief Clinical Note:   The patient is a 30 year old male who is now approximate 5 years status post an open Bankart repair for recurrent anterior shoulder instability. The patient did well for nearly 4 years before he began to notice recurrent feelings of instability anteriorly although he did not actually redislocate. An MRI scan demonstrated a near complete tear of the subscapularis tendon with retraction medial to the glenoid. The patient presents at this time for a right shoulder arthroscopy with open repair of the subscapularis tendon tear.  Procedure:   The patient underwent placement of an interscalene block by the anesthesiologist in the preoperative holding area before he was brought into the operating room and lain in the supine position. The patient then underwent general endotracheal intubation and anesthesia before being repositioned in the beach chair position using the beach chair positioner. The right shoulder and upper extremity were prepped with ChloraPrep solution before being draped sterilely. Preoperative antibiotics were  administered. A timeout was performed to confirm the proper surgical site before the expected portal sites and incision site were injected with 0.5% Sensorcaine with epinephrine. A posterior portal was created and the glenohumeral joint thoroughly inspected with the findings as described above. An anterior portal was created using an outside-in technique. The labrum and rotator cuff were further probed, again confirming the above-noted findings. There was no evidence of a recurrent Bankart tear. The biceps tendon was in excellent condition. Areas of synovitis anteriorly and superiorly were debrided using the full-radius resector before the instruments were removed from the joint after suctioning the excess fluid.   The previous anterior incision was utilized. The previous scar was ellipsed before the incision was carried down through the subcutaneous tissues to expose the deltopectoral fascia. The interval between the deltoid and pectoralis muscles was identified and this plane developed, retracting the cephalic vein laterally with the deltoid muscle. The conjoined tendon was identified. The lateral margin was dissected and the Kolbel self-retraining retractor inserted. The bursal tissues were removed anteriorly to expose the portion of the subscapularis tendon still attached to the lesser tuberosity. Careful blunt dissection was carried out medially both superficial and deep to the subscapularis muscle, releasing adhesions in order to better mobilize the subscapularis muscle. Several #0 Ethibond sutures were placed to help control the lateral margin of the tendon. Once it had been mobilized sufficiently, two #2 Arthrex fiber tapes were passed through the free lateral margin of the subscapularis tendon/muscle and secured along the medial edge of the lesser tuberosity at the articular margin using two Arthrex SwiveLock anchors. This was done  with the arm at approximately 35 of external rotation. The central #2  FiberWire suture retained in each Arthrex SwiveLock anchor was then brought back through the lateral portion of the subscapularis tendon still attached to the lesser tuberosity and tied securely. Several additional #0 Ethibond sutures were used to secure the medial margin of the lateral portion of the subscapularis tendon back to the subscapularis muscle in a vest over pants fashion to reinforce this repair.  The wound was copiously irrigated with bacitracin saline solution using bulb irrigation. The deltopectoral interval was closed using 2-0 Vicryl interrupted sutures before the subcutaneous tissues also were closed using 2-0 Vicryl interrupted sutures. The skin was closed using staples. Sterile occlusive dressings were applied to both wounds before the arm was placed into a shoulder immobilizer with an abduction pillow. The patient was then awakened, extubated, and returned to the recovery room in satisfactory condition after tolerating the procedure well.

## 2015-06-25 NOTE — OR Nursing (Signed)
Dr piscitello informed patient of the risk of burn if ear ring not removed from right ear- pt states it will not come out - he will take the risk.

## 2015-06-25 NOTE — Discharge Instructions (Addendum)
Keep dressings intact.  May bathe/wash over intact OpSite dressings as of Monday.  Apply ice frequently to shoulder. Keep shoulder immobilizer on at all times except may remove for bathing purposes. Follow-up in 10-14 days or as scheduled.  AMBULATORY SURGERY  DISCHARGE INSTRUCTIONS   1) The drugs that you were given will stay in your system until tomorrow so for the next 24 hours you should not:  A) Drive an automobile B) Make any legal decisions C) Drink any alcoholic beverage   2) You may resume regular meals tomorrow.  Today it is better to start with liquids and gradually work up to solid foods.  You may eat anything you prefer, but it is better to start with liquids, then soup and crackers, and gradually work up to solid foods.   3) Please notify your doctor immediately if you have any unusual bleeding, trouble breathing, redness and pain at the surgery site, drainage, fever, or pain not relieved by medication.    4) Additional Instructions:        Please contact your physician with any problems or Same Day Surgery at [phone], Monday through Friday 6 am to 4 pm, or [place] [facility] number at [phone].

## 2015-06-26 ENCOUNTER — Encounter: Payer: Self-pay | Admitting: Surgery

## 2016-05-11 ENCOUNTER — Encounter: Payer: Self-pay | Admitting: Emergency Medicine

## 2016-05-11 ENCOUNTER — Ambulatory Visit
Admission: EM | Admit: 2016-05-11 | Discharge: 2016-05-11 | Disposition: A | Discharge status: Home / Self Care | Payer: BLUE CROSS/BLUE SHIELD | Attending: Family Medicine | Admitting: Family Medicine

## 2016-05-11 DIAGNOSIS — Z9889 Other specified postprocedural states: Secondary | ICD-10-CM | POA: Diagnosis not present

## 2016-05-11 DIAGNOSIS — S46911A Strain of unspecified muscle, fascia and tendon at shoulder and upper arm level, right arm, initial encounter: Secondary | ICD-10-CM

## 2016-05-11 MED ORDER — HYDROCODONE-ACETAMINOPHEN 5-325 MG PO TABS: ORAL_TABLET | ORAL | 0 refills | Status: DC

## 2016-05-11 NOTE — ED Provider Notes (Signed)
[facility]    CSN: [id number] Arrival date & time: 05/11/16  1309     History   Chief Complaint Chief Complaint  Patient presents with  . Shoulder Pain    right    HPI Thomas Peters is a 31 y.o. male.   HPI  Past Medical History:  Diagnosis Date  . Adopted   . Anxiety   . Bipolar disorder (HCC)   . 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 PTSD (post-traumatic stress disorder)     There are no active problems to display for this patient.   Past Surgical History:  Procedure Laterality Date  . EYE SURGERY    . KNEE SURGERY    . SHOULDER ARTHROSCOPY WITH OPEN ROTATOR CUFF REPAIR Right 06/25/2015   Procedure: SHOULDER ARTHROSCOPY DEBRIDEMENT AND  OPEN SUBSCAP  REPAIR;  Surgeon: Christena FlakeJohn J Poggi, MD;  Location: [facility] ORS;  Service: Orthopedics;  Laterality: Right;  . SHOULDER SURGERY Right   . TONSILLECTOMY         Home Medications    Prior to Admission medications   Medication Sig Start Date End Date Taking? Authorizing Provider  HYDROcodone-acetaminophen (NORCO/VICODIN) 5-325 MG tablet 1-2 tabs po bid prn severe pain 05/11/16   Payton Mccallumrlando Halo Shevlin, MD    Family History History reviewed. No pertinent family history.  Social History Social History  Substance Use Topics  . Smoking status: Never Smoker  . Smokeless tobacco: Never Used  . Alcohol use Yes     Comment: social     Allergies   Patient has no known allergies.   Review of Systems Review of Systems   Physical Exam Triage Vital Signs ED Triage Vitals  Enc Vitals Group     BP 05/11/16 1404 136/90     Pulse Rate 05/11/16 1404 92     Resp 05/11/16 1404 16     Temp 05/11/16 1404 98.6 F (37 C)     Temp Source 05/11/16 1404 Oral     SpO2 05/11/16 1404 100 %     Weight 05/11/16 1403 (!) 301 lb (136.5 kg)     Height 05/11/16 1403 5\' 10"  (1.778 m)   Head Circumference --      Peak Flow --      Pain Score 05/11/16 1404 0     Pain Loc --      Pain Edu? --      Excl. in GC? --    No data found.   Updated Vital Signs BP 136/90 (BP Location: Left Arm)   Pulse 92   Temp 98.6 F (37 C) (Oral)   Resp 16   Ht 5\' 10"  (1.778 m)   Wt (!) 301 lb (136.5 kg)   SpO2 100%   BMI 43.19 kg/m   Visual Acuity Right Eye Distance:   Left Eye Distance:   Bilateral Distance:    Right Eye Near:   Left Eye Near:    Bilateral Near:     Physical Exam  Constitutional: He appears well-developed and well-nourished. No distress.  Musculoskeletal:       Right shoulder: He exhibits decreased range of motion, tenderness and decreased strength. He exhibits no bony tenderness, no swelling, no effusion, no crepitus, no deformity, no laceration, no spasm and normal pulse.  Skin: He is not diaphoretic.  Nursing note and vitals reviewed.  UC Treatments / Results  Labs (all labs ordered are listed, but only abnormal results are displayed) Labs Reviewed - No data to display  EKG  EKG Interpretation None       Radiology No results found.  Procedures Procedures (including critical care time)  Medications Ordered in UC Medications - No data to display   Initial Impression / Assessment and Plan / UC Course  I have reviewed the triage vital signs and the nursing notes.  Pertinent labs & imaging results that were available during my care of the patient were reviewed by me and considered in my medical decision making (see chart for details).       Final Clinical Impressions(s) / UC Diagnoses   Final diagnoses:  Strain of right shoulder, initial encounter  H/O rotator cuff surgery    New Prescriptions Discharge Medication List as of 05/11/2016  2:24 PM    START taking these medications   Details  HYDROcodone-acetaminophen (NORCO/VICODIN) 5-325 MG tablet 1-2 tabs po bid prn severe pain, Print       1. diagnosis reviewed with  patient 2. rx as per orders above; reviewed possible side effects, interactions, risks and benefits  3. Recommend supportive treatment with range of motion exercises, heat/ice 4. Follow-up with his orthopedist as scheduled   Payton Mccallum, MD 05/11/16 1700

## 2016-05-11 NOTE — ED Triage Notes (Signed)
Patient c/o right shoulder pain that started last Friday. Patient had right shoulder rotator cuff surgery back in August 2017.  Patient was recently helping a friend move.

## 2016-10-31 ENCOUNTER — Emergency Department
Admission: EM | Admit: 2016-10-31 | Discharge: 2016-10-31 | Disposition: A | Discharge status: Home / Self Care | Payer: BLUE CROSS/BLUE SHIELD | Attending: Emergency Medicine | Admitting: Emergency Medicine

## 2016-10-31 ENCOUNTER — Emergency Department: Payer: BLUE CROSS/BLUE SHIELD

## 2016-10-31 DIAGNOSIS — Y999 Unspecified external cause status: Secondary | ICD-10-CM | POA: Insufficient documentation

## 2016-10-31 DIAGNOSIS — Y92009 Unspecified place in unspecified non-institutional (private) residence as the place of occurrence of the external cause: Secondary | ICD-10-CM | POA: Insufficient documentation

## 2016-10-31 DIAGNOSIS — S76011A Strain of muscle, fascia and tendon of right hip, initial encounter: Secondary | ICD-10-CM

## 2016-10-31 DIAGNOSIS — M79661 Pain in right lower leg: Secondary | ICD-10-CM | POA: Diagnosis not present

## 2016-10-31 DIAGNOSIS — N189 Chronic kidney disease, unspecified: Secondary | ICD-10-CM | POA: Insufficient documentation

## 2016-10-31 DIAGNOSIS — Z79899 Other long term (current) drug therapy: Secondary | ICD-10-CM | POA: Insufficient documentation

## 2016-10-31 DIAGNOSIS — W19XXXA Unspecified fall, initial encounter: Secondary | ICD-10-CM | POA: Diagnosis not present

## 2016-10-31 DIAGNOSIS — S79911A Unspecified injury of right hip, initial encounter: Secondary | ICD-10-CM | POA: Diagnosis present

## 2016-10-31 DIAGNOSIS — Y939 Activity, unspecified: Secondary | ICD-10-CM | POA: Insufficient documentation

## 2016-10-31 DIAGNOSIS — M79604 Pain in right leg: Secondary | ICD-10-CM

## 2016-10-31 MED ORDER — CYCLOBENZAPRINE HCL 10 MG PO TABS: 10.0000 mg | ORAL_TABLET | Freq: Three times a day (TID) | ORAL | 0 refills | Status: DC | PRN

## 2016-10-31 MED ORDER — KETOROLAC TROMETHAMINE 30 MG/ML IJ SOLN
30.0000 mg | Freq: Once | INTRAMUSCULAR | Status: AC
  Administered 2016-10-31: 30 mg via INTRAVENOUS
  Filled 2016-10-31: qty 1

## 2016-10-31 MED ORDER — KETOROLAC TROMETHAMINE 10 MG PO TABS: 10.0000 mg | ORAL_TABLET | Freq: Four times a day (QID) | ORAL | 0 refills | Status: DC | PRN

## 2016-10-31 NOTE — ED Triage Notes (Signed)
Pt fell yesterday falling onto his right side, co right hip and right knee pain. Has ambulated with pain, pt came in by [facility], given 75 mcg of fentanyl en route.

## 2016-10-31 NOTE — ED Notes (Signed)
Pt states yest fell on carpet on R hip and R knee. States painful when walking. Alert, oriented, took 800mg  ibuprofen at home.

## 2016-10-31 NOTE — Discharge Instructions (Signed)
Images of your hip and knee do not show any fracture. Follow-up with orthopedics for symptoms that are not improving over the week. Return to the emergency department for symptoms that change or worsen if you are unable to schedule an appointment.

## 2016-10-31 NOTE — ED Provider Notes (Signed)
[facility] Emergency Department Provider Note ____________________________________________  Time seen: Approximately 9:23 PM  I have reviewed the triage vital signs and the nursing notes.   HISTORY  Chief Complaint Fall  HPI Thomas Peters is a 31 y.o. male who presents to the emergency department for evaluation of right hip and knee pain post mechanical, non-syncopal fall yesterday at home. He states that his pain has continued throughout the day despite taking ibuprofen.   Past Medical History:  Diagnosis Date  . Adopted   . Anxiety   . Bipolar disorder (HCC)   . 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 PTSD (post-traumatic stress disorder)     There are no active problems to display for this patient.   Past Surgical History:  Procedure Laterality Date  . EYE SURGERY    . KNEE SURGERY    . SHOULDER ARTHROSCOPY WITH OPEN ROTATOR CUFF REPAIR Right 06/25/2015   Procedure: SHOULDER ARTHROSCOPY DEBRIDEMENT AND  OPEN SUBSCAP  REPAIR;  Surgeon: Christena Flake, MD;  Location: [facility] ORS;  Service: Orthopedics;  Laterality: Right;  . SHOULDER SURGERY Right   . TONSILLECTOMY      Prior to Admission medications   Medication Sig Start Date End Date Taking? Authorizing Provider  cyclobenzaprine (FLEXERIL) 10 MG tablet Take 1 tablet (10 mg total) by mouth 3 (three) times daily as needed for muscle spasms. 10/31/16   Kem Boroughs B, FNP  HYDROcodone-acetaminophen (NORCO/VICODIN) 5-325 MG tablet 1-2 tabs po bid prn severe pain 05/11/16   Payton Mccallum, MD  ketorolac (TORADOL) 10 MG tablet Take 1 tablet (10 mg total) by mouth every 6 (six) hours as needed. 10/31/16   Chinita Pester, FNP    Allergies Patient has no known allergies.  No family history on file.  Social History Social History  Substance Use Topics  . Smoking  status: Never Smoker  . Smokeless tobacco: Never Used  . Alcohol use Yes     Comment: social    Review of Systems Constitutional: Negative for recent illness Cardiovascular: Negative for chest pain Respiratory: Negative for shortness of breath Musculoskeletal: Positive for right lower extremity pain Skin: Negative for rash, lesion, or wound  Neurological: Negative for loss of consciousness, numbness, or paresthesias.  ____________________________________________   PHYSICAL EXAM:  VITAL SIGNS: ED Triage Vitals  Enc Vitals Group     BP 10/31/16 1937 (!) 146/82     Pulse Rate 10/31/16 1937 (!) 108     Resp 10/31/16 1937 20     Temp 10/31/16 1937 98.3 F (36.8 C)     Temp Source 10/31/16 1937 Oral     SpO2 10/31/16 1937 98 %     Weight 10/31/16 1938 (!) 530 lb (240.4 kg)     Height 10/31/16 1938 5\' 10"  (1.778 m)     Head Circumference --      Peak Flow --      Pain Score 10/31/16 1937 3     Pain Loc --      Pain Edu? --      Excl. in GC? --     Constitutional: Alert and oriented. Well appearing and in no acute distress. Eyes: Conjunctivae are clear without discharge or drainage.  Head: Atraumatic. Neck: Active ROM observed. Respiratory: Respirations are even and unlabored. Musculoskeletal: Right hip pain increases with internal rotation. Focal tenderness elicited  with deep palpation over the lateral hip. Right knee is without deformity and patient demonstrates full, active range of motion. Neurologic: Awake, alert, and oriented 4.  Skin: Atraumatic  Psychiatric: Affect and behavior are appropriate.  ____________________________________________   LABS (all labs ordered are listed, but only abnormal results are displayed)  Labs Reviewed - No data to display ____________________________________________  RADIOLOGY  DG images of the right hip and knee negative for acute bony abnormality per radiology.  CT without contrast of the right hip negative for acute bony  abnormality per radiology. ____________________________________________   PROCEDURES  Procedure(s) performed: None  ____________________________________________   INITIAL IMPRESSION / ASSESSMENT AND PLAN / ED COURSE  Thomas Peters is a 31 y.o. male who presents to the emergency department for evaluation and treatment of right lower extremity pain. X-ray and CT of the right hip did not demonstrate acute bony abnormality. X-ray of the knee is also benign. Patient was given Toradol while here and will be given a prescription for Flexeril and Toradol to be taken after discharge. He was instructed to follow-up with orthopedics for symptoms that are not improving over the next week or so. He is advised to return to the emergency department for symptoms that change or worsen if he is unable schedule an appointment.  Pertinent labs & imaging results that were available during my care of the patient were reviewed by me and considered in my medical decision making (see chart for details).  _________________________________________   FINAL CLINICAL IMPRESSION(S) / ED DIAGNOSES  Final diagnoses:  Strain of hip flexor, right, initial encounter  Musculoskeletal pain of right lower extremity    New Prescriptions   CYCLOBENZAPRINE (FLEXERIL) 10 MG TABLET    Take 1 tablet (10 mg total) by mouth 3 (three) times daily as needed for muscle spasms.   KETOROLAC (TORADOL) 10 MG TABLET    Take 1 tablet (10 mg total) by mouth every 6 (six) hours as needed.    If controlled substance prescribed during this visit, 12 month history viewed on the [facility] prior to issuing an initial prescription for Schedule II or III opiod.    Chinita Pester, FNP 10/31/16 2346    Nita Sickle, MD 11/02/16 [phone]

## 2016-10-31 NOTE — ED Notes (Signed)
Pt taken to bathroom to urinate in wheelchair. Did NOT want this RN to stay in bathroom with him.

## 2017-02-18 ENCOUNTER — Encounter: Payer: Self-pay | Admitting: Emergency Medicine

## 2017-02-18 ENCOUNTER — Emergency Department
Admission: EM | Admit: 2017-02-18 | Discharge: 2017-02-18 | Disposition: A | Discharge status: Home / Self Care | Payer: BLUE CROSS/BLUE SHIELD | Attending: Emergency Medicine | Admitting: Emergency Medicine

## 2017-02-18 DIAGNOSIS — Y939 Activity, unspecified: Secondary | ICD-10-CM | POA: Insufficient documentation

## 2017-02-18 DIAGNOSIS — Y990 Civilian activity done for income or pay: Secondary | ICD-10-CM | POA: Insufficient documentation

## 2017-02-18 DIAGNOSIS — N189 Chronic kidney disease, unspecified: Secondary | ICD-10-CM | POA: Insufficient documentation

## 2017-02-18 DIAGNOSIS — Z79899 Other long term (current) drug therapy: Secondary | ICD-10-CM | POA: Insufficient documentation

## 2017-02-18 DIAGNOSIS — S060X0A Concussion without loss of consciousness, initial encounter: Secondary | ICD-10-CM | POA: Insufficient documentation

## 2017-02-18 DIAGNOSIS — Y9289 Other specified places as the place of occurrence of the external cause: Secondary | ICD-10-CM | POA: Insufficient documentation

## 2017-02-18 DIAGNOSIS — W228XXA Striking against or struck by other objects, initial encounter: Secondary | ICD-10-CM | POA: Insufficient documentation

## 2017-02-18 NOTE — ED Provider Notes (Signed)
[facility] Emergency Department Provider Note  ____________________________________________   First MD Initiated Contact with Patient 02/18/17 [phone]     (approximate)  I have reviewed the triage vital signs and the nursing notes.   HISTORY  Chief Complaint Head Injury   HPI Thomas Peters is a 31 y.o. male who self presents to the emergency department with 4 days of neuro symptoms.  He has had difficulty concentrating sleeping too much lightheadedness and dizziness that began suddenly 4 days ago when he was at work and he was struck on the back of the head by an 85 kg piece of wood.  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  He did not lose consciousness.  He initially went home and is felt his symptoms gradually onset.  He came to the emergency department today because while he was at work he felt lightheaded suddenly and like he might pass out so he sat down and his boss sent him to the emergency department.  Nothing seems to make his symptoms better or worse.  Past Medical History:  Diagnosis Date  . Adopted   . Anxiety   . Bipolar disorder (HCC)   . 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 PTSD (post-traumatic stress disorder)     There are no active problems to display for this patient.   Past Surgical History:  Procedure Laterality Date  . EYE SURGERY    . KNEE SURGERY     times 2  . SHOULDER ARTHROSCOPY WITH OPEN ROTATOR CUFF REPAIR Right 06/25/2015   Procedure: SHOULDER ARTHROSCOPY DEBRIDEMENT AND  OPEN SUBSCAP  REPAIR;  Surgeon: Christena FlakeJohn J Poggi, MD;  Location: [facility] ORS;  Service: Orthopedics;  Laterality: Right;  . SHOULDER SURGERY Right   . TONSILLECTOMY      Prior to Admission medications   Medication Sig Start Date End Date Taking? Authorizing Provider    cyclobenzaprine (FLEXERIL) 10 MG tablet Take 1 tablet (10 mg total) by mouth 3 (three) times daily as needed for muscle spasms. 10/31/16   Kem Boroughsriplett, Cari B, FNP  HYDROcodone-acetaminophen (NORCO/VICODIN) 5-325 MG tablet 1-2 tabs po bid prn severe pain 05/11/16   Payton Mccallumonty, [place], MD  ketorolac (TORADOL) 10 MG tablet Take 1 tablet (10 mg total) by mouth every 6 (six) hours as needed. 10/31/16   Chinita Pesterriplett, Cari B, FNP    Allergies Patient has no known allergies.  No family history on file.  Social History Social History   Tobacco Use  . Smoking status: Never Smoker  . Smokeless tobacco: Never Used  Substance Use Topics  . Alcohol use: Yes    Comment: social  . Drug use: No    Review of Systems Constitutional: No fever/chills ENT: No sore throat. Cardiovascular: Denies chest pain. Respiratory: Denies shortness of breath. Gastrointestinal: No abdominal pain.  Positive for nausea, no vomiting.  No diarrhea.  No constipation. Musculoskeletal: Negative for back pain. Neurological: Positive for headaches   ____________________________________________   PHYSICAL EXAM:  VITAL SIGNS: ED Triage Vitals [02/18/17 0030]  Enc Vitals Group     BP (!) 141/79     Pulse Rate 73     Resp 18     Temp 97.6 F (36.4 C)     Temp Source Oral     SpO2 100 %  Weight 260 lb (117.9 kg)     Height 5\' 10"  (1.778 m)     Head Circumference      Peak Flow      Pain Score 3     Pain Loc      Pain Edu?      Excl. in GC?     Constitutional: Alert and oriented x4 well-appearing nontoxic no diaphoresis speaks in full clear sentences Head: Atraumatic. Nose: No congestion/rhinnorhea. Mouth/Throat: No trismus Neck: No stridor.   Cardiovascular: Regular rate and rhythm no murmurs Respiratory: Normal respiratory effort.  No retractions. Gastrointestinal: Soft nontender he Neurologic:  Normal speech and language. No gross focal neurologic deficits are appreciated.  Skin:  Skin is warm, dry and  intact. No rash noted.    ____________________________________________  LABS (all labs ordered are listed, but only abnormal results are displayed)  Labs Reviewed - No data to display   __________________________________________  EKG   ____________________________________________  RADIOLOGY   ____________________________________________   DIFFERENTIAL includes but not limited to  Concussion, postconcussive syndrome, intracerebral hemorrhage   PROCEDURES  Procedure(s) performed: no  Procedures  Critical Care performed: no  Observation: no ____________________________________________   INITIAL IMPRESSION / ASSESSMENT AND PLAN / ED COURSE  Pertinent labs & imaging results that were available during my care of the patient were reviewed by me and considered in my medical decision making (see chart for details).  The patient is very well-appearing and neuro intact.  No indication for advanced imaging at this time.  I had a lengthy discussion with the patient regarding the predicted clinical course of postconcussive symptoms and that it is normal for his symptoms to last up to a full month.  Strict return precautions have been given and patient verbalized understanding and agreement with plan.      ____________________________________________   FINAL CLINICAL IMPRESSION(S) / ED DIAGNOSES  Final diagnoses:  Concussion without loss of consciousness, initial encounter      NEW MEDICATIONS STARTED DURING THIS VISIT:  This SmartLink is deprecated. Use AVSMEDLIST instead to display the medication list for a patient.   Note:  This document was prepared using Dragon voice recognition software and may include unintentional dictation errors.      Merrily Brittleifenbark, Kirstan Fentress, MD 02/18/17 [phone]

## 2017-02-18 NOTE — ED Triage Notes (Signed)
Patient brought in by ems. Patient states that Wednesday night he was at work at a head board fell and hit him on the back of his. Patient denies LOC but states that he was dizzy at the time. Patient states that he has had intermittent dizziness since then. Patient denies taking anticoagulants.

## 2017-02-18 NOTE — Discharge Instructions (Signed)
It is normal for the symptoms of a concussion to last up to a full month.  Please make sure you get plenty of rest and follow up with primary care as needed.  Return to the ED for any concerns.  It was a pleasure to take care of you today, and thank you for coming to our emergency department.  If you have any questions or concerns before leaving please ask the nurse to grab me and I'm more than happy to go through your aftercare instructions again.  If you were prescribed any opioid pain medication today such as Norco, Vicodin, Percocet, morphine, hydrocodone, or oxycodone please make sure you do not drive when you are taking this medication as it can alter your ability to drive safely.  If you have any concerns once you are home that you are not improving or are in fact getting worse before you can make it to your follow-up appointment, please do not hesitate to call 911 and come back for further evaluation.  Merrily BrittleNeil Hayzen Lorenson, MD

## 2017-04-23 ENCOUNTER — Emergency Department
Admission: EM | Admit: 2017-04-23 | Discharge: 2017-04-23 | Disposition: A | Discharge status: Home / Self Care | Payer: Self-pay | Attending: Student in an Organized Health Care Education/Training Program | Admitting: Student in an Organized Health Care Education/Training Program

## 2017-04-23 ENCOUNTER — Other Ambulatory Visit: Payer: Self-pay

## 2017-04-23 DIAGNOSIS — F319 Bipolar disorder, unspecified: Secondary | ICD-10-CM | POA: Insufficient documentation

## 2017-04-23 DIAGNOSIS — N189 Chronic kidney disease, unspecified: Secondary | ICD-10-CM | POA: Insufficient documentation

## 2017-04-23 DIAGNOSIS — F4310 Post-traumatic stress disorder, unspecified: Secondary | ICD-10-CM | POA: Insufficient documentation

## 2017-04-23 DIAGNOSIS — F419 Anxiety disorder, unspecified: Secondary | ICD-10-CM | POA: Insufficient documentation

## 2017-04-23 DIAGNOSIS — Z79899 Other long term (current) drug therapy: Secondary | ICD-10-CM | POA: Insufficient documentation

## 2017-04-23 LAB — CBC
HCT: 46.6 % (ref 40.0–52.0)
Hemoglobin: 15.8 g/dL (ref 13.0–18.0)
MCH: 31.9 pg (ref 26.0–34.0)
MCHC: 33.9 g/dL (ref 32.0–36.0)
MCV: 94.1 fL (ref 80.0–100.0)
PLATELETS: 233 10*3/uL (ref 150–440)
RBC: 4.95 MIL/uL (ref 4.40–5.90)
RDW: 14.7 % — ABNORMAL HIGH (ref 11.5–14.5)
WBC: 7.5 10*3/uL (ref 3.8–10.6)

## 2017-04-23 LAB — COMPREHENSIVE METABOLIC PANEL
ALT: 35 U/L (ref 17–63)
ANION GAP: 8 (ref 5–15)
AST: 26 U/L (ref 15–41)
Albumin: 4.5 g/dL (ref 3.5–5.0)
Alkaline Phosphatase: 55 U/L (ref 38–126)
BUN: 8 mg/dL (ref 6–20)
CHLORIDE: 106 mmol/L (ref 101–111)
CO2: 25 mmol/L (ref 22–32)
CREATININE: 0.85 mg/dL (ref 0.61–1.24)
Calcium: 9.6 mg/dL (ref 8.9–10.3)
Glucose, Bld: 114 mg/dL — ABNORMAL HIGH (ref 65–99)
POTASSIUM: 4.3 mmol/L (ref 3.5–5.1)
SODIUM: 139 mmol/L (ref 135–145)
Total Bilirubin: 1.5 mg/dL — ABNORMAL HIGH (ref 0.3–1.2)
Total Protein: 7.8 g/dL (ref 6.5–8.1)

## 2017-04-23 LAB — URINE DRUG SCREEN, QUALITATIVE (ARMC ONLY)
Amphetamines, Ur Screen: NOT DETECTED
BARBITURATES, UR SCREEN: NOT DETECTED
Benzodiazepine, Ur Scrn: NOT DETECTED
CANNABINOID 50 NG, UR ~~LOC~~: NOT DETECTED
Cocaine Metabolite,Ur ~~LOC~~: NOT DETECTED
MDMA (ECSTASY) UR SCREEN: NOT DETECTED
METHADONE SCREEN, URINE: NOT DETECTED
Opiate, Ur Screen: NOT DETECTED
Phencyclidine (PCP) Ur S: NOT DETECTED
Tricyclic, Ur Screen: NOT DETECTED

## 2017-04-23 LAB — ACETAMINOPHEN LEVEL

## 2017-04-23 LAB — SALICYLATE LEVEL: Salicylate Lvl: <7 mg/dL (ref 2.8–30.0)

## 2017-04-23 LAB — ETHANOL

## 2017-04-23 MED ORDER — PRAZOSIN HCL 1 MG PO CAPS: 1.0000 mg | ORAL_CAPSULE | Freq: Every day | ORAL | 0 refills | Status: AC

## 2017-04-23 MED ORDER — SERTRALINE HCL 50 MG PO TABS: 50.0000 mg | ORAL_TABLET | Freq: Every day | ORAL | 0 refills | Status: AC

## 2017-04-23 NOTE — ED Notes (Signed)
Pt dressed out. Pt belongings bag: shoes, phone, watch, wallet 117$ cash, socks, shirt, 2 pants, belt, hat, briefs, tongue ring, 2 earrings.. Pt bag handed off to Ryland GroupQuad RN.

## 2017-04-23 NOTE — BH Assessment (Signed)
Writer called and left a HIPPA Compliant message with girlfriend ([name]), requesting a return phone call.   Seeking collateral information for patient.

## 2017-04-23 NOTE — ED Notes (Signed)
[facility] at bedside

## 2017-04-23 NOTE — ED Notes (Addendum)
Pt belongings: gray shirt, black pants, black dress shoes, gray button down shirt. Watch. Black gauges. Cell phone.   Family went home instead of lobby.

## 2017-04-23 NOTE — BH Assessment (Signed)
After talking with the patient's girlfriend Clinical research associatewriter informed the ED MD (Dr. Roxan Hockeyobinson) of conversation. [facility]. [facility] and informed [facility] of the conversation. [facility] patient informed him of the same information and the disposition will remain the same. Writer updated ER MD (Dr. Roxan Hockeyobinson) and patient to be discharge.  Discussed patient with ER MD (Dr. Roxan Hockeyobinson) and patient is able to discharge home when medically cleared. Patient was giving referral information and instructions on how to follow up with Outpatient Treatment ([facility] and [facility].  Patient denies SI/HI and AV/H.

## 2017-04-23 NOTE — ED Notes (Signed)
SOC is recommending Zoloft, follow up care referrals for PTSD and anxiety, and discharge to home.

## 2017-04-23 NOTE — ED Provider Notes (Addendum)
[facility] Emergency Department Provider Note    First MD Initiated Contact with Patient 04/23/17 [phone]     (approximate)  I have reviewed the triage vital signs and the nursing notes.   HISTORY  Chief Complaint Psychiatric Evaluation    HPI JAMERSON VONBARGEN is a 32 y.o. male with a history of anxiety, PTSD and bipolar disorder not on any psychiatric medications presents for evaluation of worsening anxiety, decreased sleep, agitation and for evaluation by psychiatry.  He is new to the area.  Was previously on medication but it was several years ago.  States that last night he was having some fleeting ideas of suicidal ideation and "wanting to end it" but did not report a plan.  Spoke with with 1 of his friends on the phone for several hours and was able to calm him down.  He denies any SI or HI at this time.  Would like to get some help.  Denies any other complaints.  No substance abuse.  Past Medical History:  Diagnosis Date  . Adopted   . Anxiety   . Bipolar disorder (HCC)   . 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 PTSD (post-traumatic stress disorder)    History reviewed. No pertinent family history. Past Surgical History:  Procedure Laterality Date  . EYE SURGERY    . KNEE SURGERY     times 2  . SHOULDER ARTHROSCOPY WITH OPEN ROTATOR CUFF REPAIR Right 06/25/2015   Procedure: SHOULDER ARTHROSCOPY DEBRIDEMENT AND  OPEN SUBSCAP  REPAIR;  Surgeon: Christena Flake, MD;  Location: [facility] ORS;  Service: Orthopedics;  Laterality: Right;  . SHOULDER SURGERY Right   . TONSILLECTOMY     There are no active problems to display for this patient.     Prior to Admission medications   Medication Sig Start Date End Date Taking? Authorizing Provider  cyclobenzaprine (FLEXERIL) 10 MG tablet Take 1 tablet (10 mg total) by mouth 3  (three) times daily as needed for muscle spasms. 10/31/16   Kem Boroughs B, FNP  HYDROcodone-acetaminophen (NORCO/VICODIN) 5-325 MG tablet 1-2 tabs po bid prn severe pain 05/11/16   Payton Mccallum, MD  ketorolac (TORADOL) 10 MG tablet Take 1 tablet (10 mg total) by mouth every 6 (six) hours as needed. 10/31/16   Chinita Pester, FNP    Allergies Patient has no known allergies.    Social History Social History   Tobacco Use  . Smoking status: Never Smoker  . Smokeless tobacco: Never Used  Substance Use Topics  . Alcohol use: Yes    Comment: started drinking heavy again per pt   . Drug use: No    Review of Systems Patient denies headaches, rhinorrhea, blurry vision, numbness, shortness of breath, chest pain, edema, cough, abdominal pain, nausea, vomiting, diarrhea, dysuria, fevers, rashes or hallucinations unless otherwise stated above in HPI. ____________________________________________   PHYSICAL EXAM:  VITAL SIGNS: Vitals:   04/23/17 1340  BP: 130/81  Pulse: 70  Resp: 18  Temp: 98.2 F (36.8 C)  SpO2: 97%    Constitutional: Alert and oriented. Well appearing and in no acute distress. Eyes: Conjunctivae are normal.  Head: Atraumatic. Nose: No congestion/rhinnorhea. Mouth/Throat: Mucous membranes are moist.   Neck: No stridor. Painless ROM.  Cardiovascular: Normal rate, regular rhythm. Grossly normal heart sounds.  Good peripheral circulation. Respiratory: Normal respiratory effort.  No retractions. Lungs CTAB. Gastrointestinal: Soft and nontender. No distention. No abdominal bruits. No CVA tenderness. Genitourinary:  Musculoskeletal: No lower extremity tenderness nor edema.  No joint effusions. Neurologic:  Normal speech and language. No gross focal neurologic deficits are appreciated. No facial droop Skin:  Skin is warm, dry and intact. No rash noted. Psychiatric: Mood and affect are normal. Slightly hypervigilant but no agitation. Normal and appropriate thought  process  ____________________________________________   LABS (all labs ordered are listed, but only abnormal results are displayed)  Results for orders placed or performed during the hospital encounter of 04/23/17 (from the past 24 hour(s))  Comprehensive metabolic panel     Status: Abnormal   Collection Time: 04/23/17  1:50 PM  Result Value Ref Range   Sodium 139 135 - 145 mmol/L   Potassium 4.3 3.5 - 5.1 mmol/L   Chloride 106 101 - 111 mmol/L   CO2 25 22 - 32 mmol/L   Glucose, Bld 114 (H) 65 - 99 mg/dL   BUN 8 6 - 20 mg/dL   Creatinine, Ser 1.30 0.61 - 1.24 mg/dL   Calcium 9.6 8.9 - 86.5 mg/dL   Total Protein 7.8 6.5 - 8.1 g/dL   Albumin 4.5 3.5 - 5.0 g/dL   AST 26 15 - 41 U/L   ALT 35 17 - 63 U/L   Alkaline Phosphatase 55 38 - 126 U/L   Total Bilirubin 1.5 (H) 0.3 - 1.2 mg/dL   GFR calc non Af Amer >60 >60 mL/min   GFR calc Af Amer >60 >60 mL/min   Anion gap 8 5 - 15  Ethanol     Status: None   Collection Time: 04/23/17  1:50 PM  Result Value Ref Range   Alcohol, Ethyl (B) <10 <10 mg/dL  Salicylate level     Status: None   Collection Time: 04/23/17  1:50 PM  Result Value Ref Range   Salicylate Lvl <7.0 2.8 - 30.0 mg/dL  Acetaminophen level     Status: Abnormal   Collection Time: 04/23/17  1:50 PM  Result Value Ref Range   Acetaminophen (Tylenol), Serum <10 (L) 10 - 30 ug/mL  cbc     Status: Abnormal   Collection Time: 04/23/17  1:50 PM  Result Value Ref Range   WBC 7.5 3.8 - 10.6 K/uL   RBC 4.95 4.40 - 5.90 MIL/uL   Hemoglobin 15.8 13.0 - 18.0 g/dL   HCT 78.4 69.6 - 29.5 %   MCV 94.1 80.0 - 100.0 fL   MCH 31.9 26.0 - 34.0 pg   MCHC 33.9 32.0 - 36.0 g/dL   RDW 28.4 (H) 13.2 - 44.0 %   Platelets 233 150 - 440 K/uL  Urine Drug Screen, Qualitative     Status: None   Collection Time: 04/23/17  1:50 PM  Result Value Ref Range   Tricyclic, Ur Screen NONE DETECTED NONE DETECTED   Amphetamines, Ur Screen NONE DETECTED NONE DETECTED   MDMA (Ecstasy)Ur Screen NONE  DETECTED NONE DETECTED   Cocaine Metabolite,Ur [place] NONE DETECTED NONE DETECTED   Opiate, Ur Screen NONE DETECTED NONE DETECTED   Phencyclidine (PCP) Ur S NONE DETECTED NONE DETECTED   Cannabinoid 50 Ng, Ur [place] NONE DETECTED NONE DETECTED   Barbiturates, Ur Screen NONE DETECTED NONE DETECTED   Benzodiazepine, Ur Scrn NONE DETECTED NONE DETECTED   Methadone Scn, Ur NONE DETECTED NONE DETECTED   ____________________________________________ ____________________________________________  RADIOLOGY   ____________________________________________   PROCEDURES  Procedure(s) performed:  Procedures  Critical Care performed: no ____________________________________________   INITIAL IMPRESSION / ASSESSMENT AND PLAN / ED COURSE  Pertinent labs & imaging results that were available during my care of the patient were reviewed by me and considered in my medical decision making (see chart for details).  DDX: Psychosis, delirium, medication effect, noncompliance, polysubstance abuse, Si, Hi, depression   Ethelda ChickJoseph L Mcgroarty is a 32 y.o. who presents to the ED with for evaluation of PTSD, anxiety and passive SI.  Patient has psych history of PTSD, bipolar disorder and anxiety.  Laboratory testing was ordered to evaluation for underlying electrolyte derangement or signs of underlying organic pathology to explain today's presentation.  Based on history and physical and laboratory evaluation, it appears that the patient's presentation is 2/2 underlying psychiatric disorder and will require further evaluation and management by inpatient psychiatry.   Disposition pending psychiatric evaluation.  At this point I do not believe the patient meets criteria for IVC but would benefit from psychiatric evaluation and possibly voluntary placement.     The patient has been evaluated at bedside by Dr. Rob BuntingHorwath, psychiatry.  Patient is clinically stable.  Not felt to be a danger to self or others.  No SI or Hi.  No  indication for inpatient psychiatric admission at this time.  Appropriate for continued outpatient therapy.    ____________________________________________   FINAL CLINICAL IMPRESSION(S) / ED DIAGNOSES  Final diagnoses:  Anxiety  PTSD (post-traumatic stress disorder)      NEW MEDICATIONS STARTED DURING THIS VISIT:  New Prescriptions   No medications on file     Note:  This document was prepared using Dragon voice recognition software and may include unintentional dictation errors.    Willy Eddyobinson, Amed Datta, MD 04/23/17 1513    Willy Eddyobinson, Ermon Sagan, MD 04/23/17 [phone]

## 2017-04-23 NOTE — ED Triage Notes (Signed)
Pt states past week has been feeling depressed. Lack of sleep and lack of eating. States night terrors from PTSD. Has been forcing self to stay awake. States he talked to someone last night who helped calm him down from feeling like he wanted to end things. Does not have outpt psych doctor. Not on meds for psych issues. Alert, oriented, ambulatory.   Hx PTSD.

## 2017-04-23 NOTE — ED Notes (Signed)
Called dietary to get pt meal tray.
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
## 2017-04-23 NOTE — ED Notes (Signed)
Pt agreeable to process of changing out, blood work, urine. Pt visitors escorted back to lobby to wait.

## 2017-04-25 ENCOUNTER — Emergency Department
Admission: EM | Admit: 2017-04-25 | Discharge: 2017-04-25 | Disposition: A | Discharge status: Home / Self Care | Payer: Worker's Compensation | Attending: Emergency Medicine | Admitting: Emergency Medicine

## 2017-04-25 ENCOUNTER — Emergency Department: Payer: Worker's Compensation

## 2017-04-25 ENCOUNTER — Encounter: Payer: Self-pay | Admitting: Physician Assistant

## 2017-04-25 ENCOUNTER — Other Ambulatory Visit: Payer: Self-pay

## 2017-04-25 DIAGNOSIS — M25511 Pain in right shoulder: Secondary | ICD-10-CM

## 2017-04-25 DIAGNOSIS — Y9389 Activity, other specified: Secondary | ICD-10-CM | POA: Insufficient documentation

## 2017-04-25 DIAGNOSIS — X500XXA Overexertion from strenuous movement or load, initial encounter: Secondary | ICD-10-CM | POA: Insufficient documentation

## 2017-04-25 DIAGNOSIS — Y9269 Other specified industrial and construction area as the place of occurrence of the external cause: Secondary | ICD-10-CM | POA: Insufficient documentation

## 2017-04-25 DIAGNOSIS — Y990 Civilian activity done for income or pay: Secondary | ICD-10-CM | POA: Insufficient documentation

## 2017-04-25 MED ORDER — MELOXICAM 15 MG PO TABS: 15.0000 mg | ORAL_TABLET | Freq: Every day | ORAL | 2 refills | Status: AC

## 2017-04-25 MED ORDER — BACLOFEN 10 MG PO TABS: 10.0000 mg | ORAL_TABLET | Freq: Every day | ORAL | 1 refills | Status: AC

## 2017-04-25 NOTE — ED Notes (Signed)
workmans comp uds done, sealed, and walked to lab

## 2017-04-25 NOTE — ED Triage Notes (Signed)
Pt was moving heavy crate at work when he heard a "pop" to right shoulder area, is having pain to area since. Pain worse on movement, hx of rotator cuff repair to same shoulder.

## 2017-04-25 NOTE — Discharge Instructions (Signed)
Follow-up with Dr. Allena KatzPatel or an orthopedic doctor of workers comp choice.  Please call your HR department to have them schedule an appointment for you.  They will need the carriers insurance and case number.  Your x-ray today did not show a fracture.  However this does not show soft tissue injury such as rotator cuff tears.  Please wear the sling to prevent additional injury to the shoulder.  Use ice as often as possible to the right shoulder to decrease inflammation and swelling.  Take the anti-inflammatory and muscle relaxer as needed.  He will be on light duty.  He should not use his right arm until you have been released by orthopedics

## 2017-04-25 NOTE — ED Provider Notes (Signed)
[facility] Emergency Department Provider Note  ____________________________________________   First MD Initiated Contact with Patient 04/25/17 2221     (approximate)  I have reviewed the triage vital signs and the nursing notes.   HISTORY  Chief Complaint Shoulder Pain    HPI Thomas Peters is a 32 y.o. male presents to the emergency department stating that he hurt his right shoulder at work.  He works in a [employer] and was lifting a box and he felt a pop in his shoulder.  He has had decreased range of motion since he felt a pop.  He is not been able to raise his arm up as usual.  He states it is painful to try and reach overhead.  He states that his elbow will not straighten as normal when he is extending his arm.  The patient has a history of rotator cuff surgery about 2 years ago.  This was not a workers comp related injury.  Past Medical History:  Diagnosis Date  . Adopted   . Anxiety   . Bipolar disorder (HCC)   . 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 PTSD (post-traumatic stress disorder)     There are no active problems to display for this patient.   Past Surgical History:  Procedure Laterality Date  . EYE SURGERY    . KNEE SURGERY     times 2  . SHOULDER ARTHROSCOPY WITH OPEN ROTATOR CUFF REPAIR Right 06/25/2015   Procedure: SHOULDER ARTHROSCOPY DEBRIDEMENT AND  OPEN SUBSCAP  REPAIR;  Surgeon: Christena FlakeJohn J Poggi, MD;  Location: [facility] ORS;  Service: Orthopedics;  Laterality: Right;  . SHOULDER SURGERY Right   . TONSILLECTOMY      Prior to Admission medications   Medication Sig Start Date End Date Taking? Authorizing Provider  baclofen (LIORESAL) 10 MG tablet Take 1 tablet (10 mg total) by mouth daily. 04/25/17 04/25/18  Viren Lebeau, Roselyn BeringSusan W, PA-C  ibuprofen (ADVIL,MOTRIN) 200 MG tablet Take 800 mg by mouth every  6 (six) hours as needed.    [provider]  meloxicam (MOBIC) 15 MG tablet Take 1 tablet (15 mg total) by mouth daily. 04/25/17 04/25/18  Fajr Fife, Roselyn BeringSusan W, PA-C  prazosin (MINIPRESS) 1 MG capsule Take 1 capsule (1 mg total) by mouth at bedtime. 04/23/17 05/23/17  Willy Eddyobinson, Patrick, MD  sertraline (ZOLOFT) 50 MG tablet Take 1 tablet (50 mg total) by mouth daily. 04/23/17 04/23/18  Willy Eddyobinson, Patrick, MD    Allergies Patient has no known allergies.  No family history on file.  Social History Social History   Tobacco Use  . Smoking status: Never Smoker  . Smokeless tobacco: Never Used  Substance Use Topics  . Alcohol use: Yes    Comment: started drinking heavy again per pt   . Drug use: No    Review of Systems  Constitutional: No fever/chills Eyes: No visual changes. ENT: No sore throat. Respiratory: Denies cough Genitourinary: Negative for dysuria. Musculoskeletal: Negative for back pain.  Positive for right shoulder pain Skin: Negative for rash.    ____________________________________________   PHYSICAL EXAM:  VITAL SIGNS: ED Triage Vitals [04/25/17 2123]  Enc Vitals Group     BP 129/73     Pulse Rate 67     Resp 18     Temp 98.6 F (37 C)     Temp Source Oral  SpO2 100 %     Weight 215 lb (97.5 kg)     Height 5\' 10"  (1.778 m)     Head Circumference      Peak Flow      Pain Score 8     Pain Loc      Pain Edu?      Excl. in GC?     Constitutional: Alert and oriented. Well appearing and in no acute distress. Eyes: Conjunctivae are normal.  Head: Atraumatic. Nose: No congestion/rhinnorhea. Mouth/Throat: Mucous membranes are moist.   Cardiovascular: Normal rate, regular rhythm. Respiratory: Normal respiratory effort.  No retractions GU: deferred Musculoskeletal: Decreased range of motion of the right shoulder with abduction.  Decreased range of motion with internal rotation.  He can only go to the belt line.  Positive Hawks sign.  Patient has pain  in the joint with all range of motion.  Neurovascular is intact Neurologic:  Normal speech and language.  Skin:  Skin is warm, dry and intact. No rash noted. Psychiatric: Mood and affect are normal. Speech and behavior are normal.  ____________________________________________   LABS (all labs ordered are listed, but only abnormal results are displayed)  Labs Reviewed - No data to display ____________________________________________   ____________________________________________  RADIOLOGY  X-ray of the right shoulder is negative for fracture or dislocation  ____________________________________________   PROCEDURES  Procedure(s) performed: Sling applied to the right shoulder by the tech  Procedures    ____________________________________________   INITIAL IMPRESSION / ASSESSMENT AND PLAN / ED COURSE  Pertinent labs & imaging results that were available during my care of the patient were reviewed by me and considered in my medical decision making (see chart for details).  Patient is 32 year old male presents emergency department after a workers comp injury where he was lifting a box overhead and felt his shoulder pop.  On physical exam this shoulder is tender at the rotator cuff.  He has decreased range of motion.  X-ray of the right shoulder is negative for fracture or dislocation  X-ray results were discussed with the patient.  A sling was placed on the patient by the tech.  A prescription for meloxicam 15 mg daily and baclofen 10 mg 3 times daily were given to the patient.  His work restrictions include no use of the right arm until released by orthopedics.  Was instructed on how to call his HR and question if they will cover the orthopedic doctor we referred him to.  If they do not cover Dr. Allena Katz they should make an appointment for him with a orthopedic doctor of workers comp choice.  The patient states he understands will comply with instructions.  He is to apply ice  to the right shoulder as much as possible.  He was discharged in stable condition.     As part of my medical decision making, I reviewed the following data within the electronic MEDICAL RECORD NUMBER Nursing notes reviewed and incorporated, Radiograph reviewed x-ray of the right shoulder is negative for fracture or dislocation, Notes from prior ED visits and [place] Controlled Substance Database  ____________________________________________   FINAL CLINICAL IMPRESSION(S) / ED DIAGNOSES  Final diagnoses:  Acute pain of right shoulder      NEW MEDICATIONS STARTED DURING THIS VISIT:  New Prescriptions   BACLOFEN (LIORESAL) 10 MG TABLET    Take 1 tablet (10 mg total) by mouth daily.   MELOXICAM (MOBIC) 15 MG TABLET    Take 1 tablet (15 mg total) by mouth  daily.     Note:  This document was prepared using Dragon voice recognition software and may include unintentional dictation errors.    Faythe Ghee, PA-C 04/25/17 2316    Phineas Semen, MD 04/25/17 2325

## 2017-09-21 ENCOUNTER — Emergency Department: Payer: Self-pay

## 2017-09-21 ENCOUNTER — Other Ambulatory Visit: Payer: Self-pay

## 2017-09-21 ENCOUNTER — Emergency Department
Admission: EM | Admit: 2017-09-21 | Discharge: 2017-09-21 | Disposition: A | Discharge status: Home / Self Care | Payer: Self-pay | Attending: Emergency Medicine | Admitting: Emergency Medicine

## 2017-09-21 DIAGNOSIS — Z79899 Other long term (current) drug therapy: Secondary | ICD-10-CM | POA: Insufficient documentation

## 2017-09-21 DIAGNOSIS — R109 Unspecified abdominal pain: Secondary | ICD-10-CM

## 2017-09-21 DIAGNOSIS — R1031 Right lower quadrant pain: Secondary | ICD-10-CM | POA: Insufficient documentation

## 2017-09-21 DIAGNOSIS — R300 Dysuria: Secondary | ICD-10-CM | POA: Insufficient documentation

## 2017-09-21 DIAGNOSIS — N189 Chronic kidney disease, unspecified: Secondary | ICD-10-CM | POA: Insufficient documentation

## 2017-09-21 LAB — CHLAMYDIA/NGC RT PCR (ARMC ONLY)
CHLAMYDIA TR: NOT DETECTED
N GONORRHOEAE: NOT DETECTED

## 2017-09-21 LAB — COMPREHENSIVE METABOLIC PANEL
ALBUMIN: 3.4 g/dL — AB (ref 3.5–5.0)
ALT: 16 U/L (ref 0–44)
ANION GAP: 6 (ref 5–15)
AST: 28 U/L (ref 15–41)
Alkaline Phosphatase: 41 U/L (ref 38–126)
BUN: 16 mg/dL (ref 6–20)
CO2: 25 mmol/L (ref 22–32)
Calcium: 8.8 mg/dL — ABNORMAL LOW (ref 8.9–10.3)
Chloride: 110 mmol/L (ref 98–111)
Creatinine, Ser: 1.06 mg/dL (ref 0.61–1.24)
GFR calc Af Amer: >60 mL/min (ref >60)
GFR calc non Af Amer: >60 mL/min (ref >60)
GLUCOSE: 118 mg/dL — AB (ref 70–99)
POTASSIUM: 4.5 mmol/L (ref 3.5–5.1)
SODIUM: 141 mmol/L (ref 135–145)
TOTAL PROTEIN: 5.8 g/dL — AB (ref 6.5–8.1)
Total Bilirubin: 0.9 mg/dL (ref 0.3–1.2)

## 2017-09-21 LAB — CBC
HEMATOCRIT: 44.1 % (ref 40.0–52.0)
HEMOGLOBIN: 15.4 g/dL (ref 13.0–18.0)
MCH: 33.6 pg (ref 26.0–34.0)
MCHC: 34.8 g/dL (ref 32.0–36.0)
MCV: 96.4 fL (ref 80.0–100.0)
Platelets: 210 10*3/uL (ref 150–440)
RBC: 4.57 MIL/uL (ref 4.40–5.90)
RDW: 13.8 % (ref 11.5–14.5)
WBC: 9.5 10*3/uL (ref 3.8–10.6)

## 2017-09-21 LAB — URINALYSIS, COMPLETE (UACMP) WITH MICROSCOPIC
Bacteria, UA: NONE SEEN
Bilirubin Urine: NEGATIVE
Glucose, UA: NEGATIVE mg/dL
Hgb urine dipstick: NEGATIVE
Ketones, ur: NEGATIVE mg/dL
Leukocytes, UA: NEGATIVE
Nitrite: NEGATIVE
PROTEIN: NEGATIVE mg/dL
SPECIFIC GRAVITY, URINE: 1.016 (ref 1.005–1.030)
SQUAMOUS EPITHELIAL / LPF: NONE SEEN (ref 0–5)
pH: 5 (ref 5.0–8.0)

## 2017-09-21 LAB — LIPASE, BLOOD: Lipase: 28 U/L (ref 11–51)

## 2017-09-21 MED ORDER — OXYCODONE-ACETAMINOPHEN 5-325 MG PO TABS
1.0000 | ORAL_TABLET | Freq: Once | ORAL | Status: AC
  Administered 2017-09-21: 1 via ORAL

## 2017-09-21 NOTE — ED Triage Notes (Addendum)
Pt comes via POV with c/o urinary rentention. Pt states it has been over 24 hours and is concerning since he only has 1 kidney. Pt states he is having to strain to urinate and feels he is not fully emptying his bladder. Pt states burning. Pt denies odor and blood in urine.

## 2017-09-21 NOTE — ED Notes (Signed)
62ml per bladder scan. MD aware.

## 2017-09-21 NOTE — ED Provider Notes (Signed)
[facility] Emergency Department Provider Note  ____________________________________________  Time seen: Approximately 7:01 PM  I have reviewed the triage vital signs and the nursing notes.   HISTORY  Chief Complaint Urinary Retention   HPI TRYGG MANTZ is a 32 y.o. 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  Patient reports for the last 2 days he has had a dysuria, frequency, and reports that he feels like he is unable to fully empty his bladder.  Also complaining of mild throbbing right flank pain that has been constant since yesterday and nonradiating.  Has had nausea but no vomiting, no diarrhea or constipation, no fever or chills.  Patient is concerned because he only has 1 functioning kidney.  He currently does not have insurance and is unable to follow-up with the specialist.  He denies ever having kidney stone or UTI.  No prior abdominal surgeries.  No penile discharge, no prior history of STD, no swelling, pain or erythema of his scrotum  Past Medical History:  Diagnosis Date  . Adopted   . Anxiety   . Bipolar disorder (HCC)   . 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 PTSD (post-traumatic stress disorder)     Past Surgical History:  Procedure Laterality Date  . EYE SURGERY    . KNEE SURGERY     times 2  . SHOULDER ARTHROSCOPY WITH OPEN ROTATOR CUFF REPAIR Right 06/25/2015   Procedure: SHOULDER ARTHROSCOPY DEBRIDEMENT AND  OPEN SUBSCAP  REPAIR;  Surgeon: Christena Flake, MD;  Location: [facility] ORS;  Service: Orthopedics;  Laterality: Right;  . SHOULDER SURGERY Right   . TONSILLECTOMY      Prior to Admission medications   Medication Sig Start Date End Date Taking?  Authorizing Provider  baclofen (LIORESAL) 10 MG tablet Take 1 tablet (10 mg total) by mouth daily. 04/25/17 04/25/18  Fisher, Roselyn Bering, PA-C  ibuprofen (ADVIL,MOTRIN) 200 MG tablet Take 800 mg by mouth every 6 (six) hours as needed.    [provider]  meloxicam (MOBIC) 15 MG tablet Take 1 tablet (15 mg total) by mouth daily. 04/25/17 04/25/18  Fisher, Roselyn Bering, PA-C  prazosin (MINIPRESS) 1 MG capsule Take 1 capsule (1 mg total) by mouth at bedtime. 04/23/17 05/23/17  Willy Eddy, MD  sertraline (ZOLOFT) 50 MG tablet Take 1 tablet (50 mg total) by mouth daily. 04/23/17 04/23/18  Willy Eddy, MD    Allergies Patient has no known allergies.  No family history on file.  Social History Social History   Tobacco Use  . Smoking status: Never Smoker  . Smokeless tobacco: Never Used  Substance Use Topics  . Alcohol use: Yes    Comment: started drinking heavy again per pt   . Drug use: No    Review of Systems  Constitutional: Negative for fever. Eyes: Negative for visual changes. ENT: Negative for sore throat. Neck: No neck pain  Cardiovascular: Negative for chest pain. Respiratory: Negative for shortness of breath. Gastrointestinal: Negative for abdominal pain, vomiting or diarrhea. Genitourinary: + dysuria, frequency, R flank pain Musculoskeletal: Negative for back pain. Skin: Negative for rash. Neurological: Negative for headaches, weakness or numbness. Psych: No SI or HI  ____________________________________________   PHYSICAL EXAM:  VITAL SIGNS: ED Triage  Vitals  Enc Vitals Group     BP 09/21/17 1750 (!) 145/84     Pulse Rate 09/21/17 1750 98     Resp 09/21/17 1750 18     Temp 09/21/17 1750 97.8 F (36.6 C)     Temp Source 09/21/17 1750 Oral     SpO2 09/21/17 1750 99 %     Weight 09/21/17 1751 200 lb (90.7 kg)     Height 09/21/17 1751 5\' 10"  (1.778 m)     Head Circumference --      Peak Flow --      Pain Score 09/21/17 1751 6     Pain Loc --       Pain Edu? --      Excl. in GC? --     Constitutional: Alert and oriented. Well appearing and in no apparent distress. HEENT:      Head: Normocephalic and atraumatic.         Eyes: Conjunctivae are normal. Sclera is non-icteric.       Mouth/Throat: Mucous membranes are moist.       Neck: Supple with no signs of meningismus. Cardiovascular: Regular rate and rhythm. No murmurs, gallops, or rubs. 2+ symmetrical distal pulses are present in all extremities. No JVD. Respiratory: Normal respiratory effort. Lungs are clear to auscultation bilaterally. No wheezes, crackles, or rhonchi.  Gastrointestinal: Soft, non tender, and non distended with positive bowel sounds. No rebound or guarding. Genitourinary: No CVA tenderness. Musculoskeletal: Nontender with normal range of motion in all extremities. No edema, cyanosis, or erythema of extremities. Neurologic: Normal speech and language. Face is symmetric. Moving all extremities. No gross focal neurologic deficits are appreciated. Skin: Skin is warm, dry and intact. No rash noted. Psychiatric: Mood and affect are normal. Speech and behavior are normal.  ____________________________________________   LABS (all labs ordered are listed, but only abnormal results are displayed)  Labs Reviewed  URINALYSIS, COMPLETE (UACMP) WITH MICROSCOPIC - Abnormal; Notable for the following components:      Result Value   Color, Urine YELLOW (*)    APPearance CLEAR (*)    All other components within normal limits  COMPREHENSIVE METABOLIC PANEL - Abnormal; Notable for the following components:   Glucose, Bld 118 (*)    Calcium 8.8 (*)    Total Protein 5.8 (*)    Albumin 3.4 (*)    All other components within normal limits  CHLAMYDIA/NGC RT PCR ([facility] ONLY)  CBC  LIPASE, BLOOD   ____________________________________________  EKG  none  ____________________________________________  RADIOLOGY  I have personally reviewed the images performed  during this visit and I agree with the Radiologist's read.   Interpretation by Radiologist:  Ct Renal Stone Study  Result Date: 09/21/2017 CLINICAL DATA:  Urinary retention over the past 24 hours. Patient has to strain to urinate and feels he is not fully emptying his bladder. Burning sensation. EXAM: CT ABDOMEN AND PELVIS WITHOUT CONTRAST TECHNIQUE: Multidetector CT imaging of the abdomen and pelvis was performed following the standard protocol without IV contrast. COMPARISON:  None. FINDINGS: Lower chest: Top-normal size heart without pericardial effusion. No active pulmonary disease. Hepatobiliary: Decompressed gallbladder without stones. The unenhanced liver is unremarkable. Pancreas: Normal Spleen: Small splenule near the splenic hilum.  No splenomegaly. Adrenals/Urinary Tract: Markedly atrophic left kidney with compensatory hypertrophy of the right kidney. No nephrolithiasis nor hydroureteronephrosis is identified. The urinary bladder is decompressed in appearance without focal mural thickening or calculus. The bladder measures 7.4 x 6.4 x 6.8 cm (volume =  170 cm^3). Stomach/Bowel: Stomach is within normal limits. Appendix appears normal. No evidence of bowel wall thickening, distention, or inflammatory changes. Vascular/Lymphatic: No significant vascular findings are present. No enlarged abdominal or pelvic lymph nodes. Reproductive: Prostate gland is unremarkable. No calculus along the penile nor prostatic urethra. Other: No abdominal wall hernia or abnormality. No abdominopelvic ascites. Musculoskeletal: No acute or significant osseous findings. IMPRESSION: Atrophic left kidney with compensatory hypertrophy of the right kidney. No obstructive uropathy is noted. The urinary bladder is partially distended and demonstrates a volume of 170 cm cubed currently. Electronically Signed   By: Tollie Ethavid  Kwon M.D.   On: 09/21/2017 18:53      ____________________________________________   PROCEDURES  Procedure(s) performed: None Procedures Critical Care performed:  None ____________________________________________   INITIAL IMPRESSION / ASSESSMENT AND PLAN / ED COURSE   32 y.o. 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  Patient is well-appearing, no distress, has normal vital signs, abdomen is soft with no tenderness, post void scan showing 0 cc, no flank tenderness. UA negative for UTI. CT renal showing no acute findings. Labs pending to rule out acute on chronic kidney injury.   _________________________ 8:12 PM on 09/21/2017 -----------------------------------------  UA negative for UTI.  CT showing no acute findings.  Labs showing normal kidney function.  At this time unclear why patient is having symptoms of dysuria or urinary frequency.  GC and Chlamydia are pending patient will be contacted if these are positive.  Patient will be referred back to his urologist. Repeat PVD after CT scan showed 28cc with no evidence of retention.  Discussed return precautions for new or worsening symptoms, abdominal pain, flank pain, fever, chills.      As part of my medical decision making, I reviewed the following data within the electronic MEDICAL RECORD NUMBER Nursing notes reviewed and incorporated, Labs reviewed , Old chart reviewed, Radiograph reviewed , Notes from prior ED visits and [place] Controlled Substance Database    Pertinent labs & imaging results that were available during my care of the patient were reviewed by me and considered in my medical decision making (see chart for details).    ____________________________________________   FINAL CLINICAL IMPRESSION(S) / ED DIAGNOSES  Final diagnoses:  Dysuria  Flank pain      NEW MEDICATIONS STARTED  DURING THIS VISIT:  ED Discharge Orders    None       Note:  This document was prepared using Dragon voice recognition software and may include unintentional dictation errors.    Nita SickleVeronese, [place], MD 09/21/17 2014

## 2017-09-21 NOTE — ED Notes (Signed)
Attempted to call lab and inform of having to send specimen with chart label. Lab not answering at this time.

## 2017-09-21 NOTE — ED Notes (Signed)
Pt verbalizes discharge instructions. Unable to obtain signature due to signature pad malfunction.

## 2018-03-04 ENCOUNTER — Encounter: Payer: Self-pay | Admitting: Emergency Medicine

## 2018-03-04 ENCOUNTER — Emergency Department
Admission: EM | Admit: 2018-03-04 | Discharge: 2018-03-04 | Disposition: A | Discharge status: Home / Self Care | Payer: Self-pay | Attending: Emergency Medicine | Admitting: Emergency Medicine

## 2018-03-04 ENCOUNTER — Other Ambulatory Visit: Payer: Self-pay

## 2018-03-04 DIAGNOSIS — N189 Chronic kidney disease, unspecified: Secondary | ICD-10-CM | POA: Insufficient documentation

## 2018-03-04 DIAGNOSIS — K029 Dental caries, unspecified: Secondary | ICD-10-CM | POA: Insufficient documentation

## 2018-03-04 DIAGNOSIS — K0381 Cracked tooth: Secondary | ICD-10-CM | POA: Insufficient documentation

## 2018-03-04 DIAGNOSIS — Z79899 Other long term (current) drug therapy: Secondary | ICD-10-CM | POA: Insufficient documentation

## 2018-03-04 DIAGNOSIS — K0889 Other specified disorders of teeth and supporting structures: Secondary | ICD-10-CM | POA: Insufficient documentation

## 2018-03-04 MED ORDER — AMOXICILLIN 875 MG PO TABS: 875.0000 mg | ORAL_TABLET | Freq: Two times a day (BID) | ORAL | 0 refills | Status: DC

## 2018-03-04 MED ORDER — TRAMADOL HCL 50 MG PO TABS
50.0000 mg | ORAL_TABLET | Freq: Once | ORAL | Status: AC
  Administered 2018-03-04: 50 mg via ORAL
  Filled 2018-03-04: qty 1

## 2018-03-04 MED ORDER — AMOXICILLIN 500 MG PO CAPS
1000.0000 mg | ORAL_CAPSULE | Freq: Once | ORAL | Status: AC
  Administered 2018-03-04: 1000 mg via ORAL
  Filled 2018-03-04: qty 2

## 2018-03-04 MED ORDER — TRAMADOL HCL 50 MG PO TABS: 50.0000 mg | ORAL_TABLET | Freq: Four times a day (QID) | ORAL | 0 refills | Status: AC | PRN

## 2018-03-04 NOTE — ED Provider Notes (Signed)
[facility] EMERGENCY DEPARTMENT Provider Note   CSN: [id number] Arrival date & time: 03/04/18  1830     History   Chief Complaint Chief Complaint  Patient presents with  . Dental Pain    HPI Thomas Peters is a 32 y.o. male presents to the emergency department for evaluation of dental pain.  Dental pain began last night.  He complains of pain to the right lower lateral incisor in the right upper second molar.  Patient has had cracked teeth at these locations for last couple months.  Is been trying Orajel, Tylenol and ibuprofen with no improvement.  He is scheduled to see a dentist January 1 once he receives Secretary/administratordental insurance.  HPI  Past Medical History:  Diagnosis Date  . Adopted   . Anxiety   . Bipolar disorder (HCC)   . 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 PTSD (post-traumatic stress disorder)     There are no active problems to display for this patient.   Past Surgical History:  Procedure Laterality Date  . EYE SURGERY    . KNEE SURGERY     times 2  . SHOULDER ARTHROSCOPY WITH OPEN ROTATOR CUFF REPAIR Right 06/25/2015   Procedure: SHOULDER ARTHROSCOPY DEBRIDEMENT AND  OPEN SUBSCAP  REPAIR;  Surgeon: Christena FlakeJohn J Poggi, MD;  Location: [facility] ORS;  Service: Orthopedics;  Laterality: Right;  . SHOULDER SURGERY Right   . TONSILLECTOMY          Home Medications    Prior to Admission medications   Medication Sig Start Date End Date Taking? Authorizing Provider  amoxicillin (AMOXIL) 875 MG tablet Take 1 tablet (875 mg total) by mouth 2 (two) times daily. X 10 days 03/04/18   Evon SlackGaines, Thomas C, PA-C  baclofen (LIORESAL) 10 MG tablet Take 1 tablet (10 mg total) by mouth daily. 04/25/17 04/25/18  Fisher, Roselyn BeringSusan W, PA-C  ibuprofen (ADVIL,MOTRIN) 200 MG tablet Take 800 mg by mouth every 6 (six) hours as needed.     [provider]  meloxicam (MOBIC) 15 MG tablet Take 1 tablet (15 mg total) by mouth daily. 04/25/17 04/25/18  Fisher, Roselyn BeringSusan W, PA-C  prazosin (MINIPRESS) 1 MG capsule Take 1 capsule (1 mg total) by mouth at bedtime. 04/23/17 05/23/17  Willy Eddyobinson, Patrick, MD  sertraline (ZOLOFT) 50 MG tablet Take 1 tablet (50 mg total) by mouth daily. 04/23/17 04/23/18  Willy Eddyobinson, Patrick, MD  traMADol (ULTRAM) 50 MG tablet Take 1-2 tablets (50-100 mg total) by mouth every 6 (six) hours as needed. 03/04/18 03/04/19  Evon SlackGaines, Thomas C, PA-C    Family History No family history on file.  Social History Social History   Tobacco Use  . Smoking status: Never Smoker  . Smokeless tobacco: Never Used  Substance Use Topics  . Alcohol use: Yes    Comment: started drinking heavy again per pt   . Drug use: No     Allergies   Patient has no known allergies.   Review of Systems Review of Systems  Constitutional: Negative.  Negative for chills and fever.  HENT: Positive for dental problem. Negative for drooling, facial swelling, mouth sores, trouble swallowing and voice change.   Respiratory: Negative for shortness of breath.   Cardiovascular: Negative for chest pain.  Gastrointestinal: Negative for nausea and vomiting.  Musculoskeletal: Negative for arthralgias, neck pain and neck stiffness.  Skin:  Negative.   Psychiatric/Behavioral: Negative for confusion.  All other systems reviewed and are negative.    Physical Exam Updated Vital Signs BP (!) 147/95 (BP Location: Left Arm)   Pulse 76   Temp 98.6 F (37 C) (Oral)   Resp 16   SpO2 99%   Physical Exam Constitutional:      General: He is not in acute distress.    Appearance: He is well-developed.  HENT:     Head: Normocephalic and atraumatic.     Jaw: No trismus.     Right Ear: External ear normal.     Left Ear: External ear normal.     Nose: Nose normal.     Mouth/Throat:     Mouth: No oral lesions.     Dentition: Normal dentition.      Pharynx: Uvula midline. No uvula swelling.   Neck:     Musculoskeletal: Normal range of motion and neck supple.  Cardiovascular:     Rate and Rhythm: Normal rate.     Heart sounds: No murmur. No friction rub. No gallop.   Pulmonary:     Effort: Pulmonary effort is normal. No respiratory distress.     Breath sounds: Normal breath sounds.  Skin:    General: Skin is warm and dry.  Neurological:     Mental Status: He is alert and oriented to person, place, and time.  Psychiatric:        Behavior: Behavior normal.        Thought Content: Thought content normal.      ED Treatments / Results  Labs (all labs ordered are listed, but only abnormal results are displayed) Labs Reviewed - No data to display  EKG None  Radiology No results found.  Procedures Procedures (including critical care time)  Medications Ordered in ED Medications  traMADol (ULTRAM) tablet 50 mg (has no administration in time range)  amoxicillin (AMOXIL) capsule 1,000 mg (has no administration in time range)     Initial Impression / Assessment and Plan / ED Course  I have reviewed the triage vital signs and the nursing notes.  Pertinent labs & imaging results that were available during my care of the patient were reviewed by me and considered in my medical decision making (see chart for details).     32 year old male with dental pain.  Has had cracked teeth for 2 months with underlying dental caries.  He is placed on antibiotics.  Vital signs are stable.  No signs of abscess formation.  He will start amoxicillin and given tramadol.  He will continue with Tylenol and ibuprofen.  Final Clinical Impressions(s) / ED Diagnoses   Final diagnoses:  Pain, dental  Toothache    ED Discharge Orders         Ordered    traMADol (ULTRAM) 50 MG tablet  Every 6 hours PRN     03/04/18 2004    amoxicillin (AMOXIL) 875 MG tablet  2 times daily     03/04/18 2004           Ronnette JuniperGaines, Thomas C, PA-C 03/04/18  2009    Phineas SemenGoodman, Graydon, MD 03/04/18 2310

## 2018-03-04 NOTE — Discharge Instructions (Addendum)
Please take medications as prescribed.  Follow-up with dental clinic January 1.  Return to ER for any fevers worsening symptoms or urgent changes in your health.

## 2018-03-04 NOTE — ED Triage Notes (Signed)
Pt to ED via POV, pt states that yesterday he started with a toothache on the right side. Today his entire right jaw is hurting. Pt is in NAD at this time.

## 2018-05-16 ENCOUNTER — Other Ambulatory Visit: Payer: Self-pay

## 2018-05-16 ENCOUNTER — Encounter (HOSPITAL_COMMUNITY): Payer: Self-pay

## 2018-05-16 ENCOUNTER — Emergency Department (HOSPITAL_COMMUNITY)
Admission: EM | Admit: 2018-05-16 | Discharge: 2018-05-16 | Disposition: A | Discharge status: Home / Self Care | Payer: 59 | Attending: Emergency Medicine | Admitting: Emergency Medicine

## 2018-05-16 DIAGNOSIS — N189 Chronic kidney disease, unspecified: Secondary | ICD-10-CM | POA: Diagnosis not present

## 2018-05-16 DIAGNOSIS — Z79899 Other long term (current) drug therapy: Secondary | ICD-10-CM | POA: Insufficient documentation

## 2018-05-16 DIAGNOSIS — M5431 Sciatica, right side: Secondary | ICD-10-CM | POA: Insufficient documentation

## 2018-05-16 DIAGNOSIS — M545 Low back pain: Secondary | ICD-10-CM | POA: Diagnosis present

## 2018-05-16 MED ORDER — OXYCODONE HCL 5 MG PO TABS
5.0000 mg | ORAL_TABLET | Freq: Once | ORAL | Status: AC
  Administered 2018-05-16: 5 mg via ORAL
  Filled 2018-05-16: qty 1

## 2018-05-16 MED ORDER — KETOROLAC TROMETHAMINE 60 MG/2ML IM SOLN
15.0000 mg | Freq: Once | INTRAMUSCULAR | Status: AC
  Administered 2018-05-16: 15 mg via INTRAMUSCULAR
  Filled 2018-05-16: qty 2

## 2018-05-16 MED ORDER — DIAZEPAM 5 MG PO TABS
5.0000 mg | ORAL_TABLET | Freq: Once | ORAL | Status: AC
  Administered 2018-05-16: 5 mg via ORAL
  Filled 2018-05-16: qty 1

## 2018-05-16 MED ORDER — ACETAMINOPHEN 500 MG PO TABS
1000.0000 mg | ORAL_TABLET | Freq: Once | ORAL | Status: AC
  Administered 2018-05-16: 1000 mg via ORAL
  Filled 2018-05-16: qty 2

## 2018-05-16 NOTE — Discharge Instructions (Signed)

## 2018-05-16 NOTE — ED Triage Notes (Signed)
Pt reports severe lower back pain that started last week. Pt denies any injury or fall. Pt denies numbness or tingling in legs

## 2018-05-16 NOTE — ED Provider Notes (Signed)
[facility] EMERGENCY DEPARTMENT Provider Note   CSN: [id number] Arrival date & time: 05/16/18  1923    History   Chief Complaint Chief Complaint  Patient presents with  . Back Pain    HPI Thomas Peters is a 33 y.o. male.     33 yo M with a chief complaint of right-sided low back pain.  Going on for the past couple days to a week.  Worse with movement palpation and twisting.  Denies fever chills denies loss of bowel or bladder denies loss perirectal sensation denies unilateral numbness or weakness to the legs denies IV drug abuse denies recent surgery or spinal injection.  Denies history of cancer.  The history is provided by the patient.  Back Pain  Location:  Lumbar spine Quality:  Aching and shooting Radiates to:  R posterior upper leg Pain severity:  Severe Pain is:  Same all the time Onset quality:  Gradual Duration:  2 days Timing:  Constant Progression:  Worsening Chronicity:  New Context: not recent injury   Relieved by:  Nothing Worsened by:  Movement and palpation Ineffective treatments:  None tried Associated symptoms: no abdominal pain, no chest pain, no fever and no headaches     Past Medical History:  Diagnosis Date  . Adopted   . Anxiety   . Bipolar disorder (HCC)   . 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 PTSD (post-traumatic stress disorder)     There are no active problems to display for this patient.   Past Surgical History:  Procedure Laterality Date  . EYE SURGERY    . KNEE SURGERY     times 2  . SHOULDER ARTHROSCOPY WITH OPEN ROTATOR CUFF REPAIR Right 06/25/2015   Procedure: SHOULDER ARTHROSCOPY DEBRIDEMENT AND  OPEN SUBSCAP  REPAIR;  Surgeon: Christena Flake, MD;  Location: [facility] ORS;  Service: Orthopedics;  Laterality: Right;  . SHOULDER SURGERY Right   . TONSILLECTOMY          Home Medications    Prior to Admission medications   Medication Sig Start Date End Date Taking? Authorizing Provider  amoxicillin (AMOXIL) 875 MG tablet Take 1 tablet (875 mg total) by mouth 2 (two) times daily. X 10 days 03/04/18   Evon Slack, PA-C  ibuprofen (ADVIL,MOTRIN) 200 MG tablet Take 800 mg by mouth every 6 (six) hours as needed.    [provider]  prazosin (MINIPRESS) 1 MG capsule Take 1 capsule (1 mg total) by mouth at bedtime. 04/23/17 05/23/17  Willy Eddy, MD  sertraline (ZOLOFT) 50 MG tablet Take 1 tablet (50 mg total) by mouth daily. 04/23/17 04/23/18  Willy Eddy, MD  traMADol (ULTRAM) 50 MG tablet Take 1-2 tablets (50-100 mg total) by mouth every 6 (six) hours as needed. 03/04/18 03/04/19  Evon Slack, PA-C    Family History No family history on file.  Social History Social History   Tobacco Use  . Smoking status: Never Smoker  . Smokeless tobacco: Never Used  Substance Use Topics  . Alcohol use: Yes    Comment: started drinking heavy again per pt   . Drug use: No     Allergies   Patient has no known allergies.   Review of Systems Review of Systems  Constitutional: Negative for chills and fever.  HENT: Negative for congestion and facial swelling.  Eyes: Negative for discharge and visual disturbance.  Respiratory: Negative for shortness of breath.   Cardiovascular: Negative for chest pain and palpitations.  Gastrointestinal: Negative for abdominal pain, diarrhea and vomiting.  Musculoskeletal: Positive for back pain. Negative for arthralgias and myalgias.  Skin: Negative for color change and rash.  Neurological: Negative for tremors, syncope and headaches.  Psychiatric/Behavioral: Negative for confusion and dysphoric mood.     Physical Exam Updated Vital Signs BP 127/75   Pulse 84   Temp 98 F (36.7 C) (Oral)   Resp 18   Ht 5\' 10"  (1.778 m)   Wt (!) 140.6 kg   SpO2 97%   BMI 44.48 kg/m   Physical Exam  Vitals signs and nursing note reviewed.  Constitutional:      Appearance: He is well-developed.  HENT:     Head: Normocephalic and atraumatic.  Eyes:     Pupils: Pupils are equal, round, and reactive to light.  Neck:     Musculoskeletal: Normal range of motion and neck supple.     Vascular: No JVD.  Cardiovascular:     Rate and Rhythm: Normal rate and regular rhythm.     Heart sounds: No murmur. No friction rub. No gallop.   Pulmonary:     Effort: No respiratory distress.     Breath sounds: No wheezing.  Abdominal:     General: There is no distension.     Tenderness: There is no guarding or rebound.  Musculoskeletal: Normal range of motion.     Comments: Mild tenderness about the right SI joint area.  No midline spinal tenderness.  Reflexes are 2+ and equal bilaterally.  No clonus.  Pulse motor and sensation are intact distally.  Skin:    Coloration: Skin is not pale.     Findings: No rash.  Neurological:     Mental Status: He is alert and oriented to person, place, and time.  Psychiatric:        Behavior: Behavior normal.      ED Treatments / Results  Labs (all labs ordered are listed, but only abnormal results are displayed) Labs Reviewed - No data to display  EKG None  Radiology No results found.  Procedures Procedures (including critical care time)  Medications Ordered in ED Medications  acetaminophen (TYLENOL) tablet 1,000 mg (1,000 mg Oral Given 05/16/18 2330)  ketorolac (TORADOL) injection 15 mg (15 mg Intramuscular Given 05/16/18 2331)  oxyCODONE (Oxy IR/ROXICODONE) immediate release tablet 5 mg (5 mg Oral Given 05/16/18 2331)  diazepam (VALIUM) tablet 5 mg (5 mg Oral Given 05/16/18 2330)     Initial Impression / Assessment and Plan / ED Course  I have reviewed the triage vital signs and the nursing notes.  Pertinent labs & imaging results that were available during my care of the patient were reviewed by me and considered in my medical decision making  (see chart for details).        33 yo M with right-sided low back pain that radiates to the posterior aspect of the buttock.  This been going on for the past few days.  Worse with movement and palpation twisting.  No red flags.  Will treat as musculoskeletal.  PCP follow-up.  Final Clinical Impressions(s) / ED Diagnoses   Final diagnoses:  Sciatica of right side    ED Discharge Orders    None       Melene Plan, DO 05/16/18 2350

## 2019-05-16 ENCOUNTER — Ambulatory Visit: Payer: Self-pay | Attending: Family

## 2019-05-16 DIAGNOSIS — Z23 Encounter for immunization: Secondary | ICD-10-CM

## 2019-05-16 NOTE — Progress Notes (Signed)
   Covid-19 Vaccination Clinic  Name:  Thomas Peters    MRN: [id number] DOB: [birth year]  05/16/2019  Mr. Thomas Peters was observed post Covid-19 immunization for 15 minutes without incident. He was provided with Vaccine Information Sheet and instruction to access the V-Safe system.   Mr. Thomas Peters was instructed to call 911 with any severe reactions post vaccine: Marland Kitchen Difficulty breathing  . Swelling of face and throat  . A fast heartbeat  . A bad rash all over body  . Dizziness and weakness   Immunizations Administered    Name Date Dose VIS Date Route   Moderna COVID-19 Vaccine 05/16/2019  3:27 PM 0.5 mL 02/05/2019 Intramuscular   Manufacturer: Moderna   Lot: [id number]   NDC: 29090-301-49

## 2019-06-18 ENCOUNTER — Ambulatory Visit: Payer: Self-pay | Attending: Family

## 2019-06-18 DIAGNOSIS — Z23 Encounter for immunization: Secondary | ICD-10-CM

## 2019-06-18 NOTE — Progress Notes (Signed)
   Covid-19 Vaccination Clinic  Name:  Thomas Peters    MRN: [id number] DOB: 1985-07-31  06/18/2019  Mr. [name] was observed post Covid-19 immunization for 15 minutes without incident. He was provided with Vaccine Information Sheet and instruction to access the V-Safe system.   Mr. [name] was instructed to call 911 with any severe reactions post vaccine: Marland Kitchen Difficulty breathing  . Swelling of face and throat  . A fast heartbeat  . A bad rash all over body  . Dizziness and weakness   Immunizations Administered    Name Date Dose VIS Date Route   Moderna COVID-19 Vaccine 06/18/2019  2:27 PM 0.5 mL 02/05/2019 Intramuscular   Manufacturer: Gala Murdoch   Lot: [id number]   NDC: 46270-350-09      Covid-19 Vaccination Clinic  Name:  Thomas Peters    MRN: [id number] DOB: Jul 31, 1985  06/18/2019  Mr. [name] was observed post Covid-19 immunization for 15 minutes without incident. He was provided with Vaccine Information Sheet and instruction to access the V-Safe system.   Mr. [name] was instructed to call 911 with any severe reactions post vaccine: Marland Kitchen Difficulty breathing  . Swelling of face and throat  . A fast heartbeat  . A bad rash all over body  . Dizziness and weakness   Immunizations Administered    Name Date Dose VIS Date Route   Moderna COVID-19 Vaccine 06/18/2019  2:27 PM 0.5 mL 02/05/2019 Intramuscular   Manufacturer: Moderna   Lot: [id number]   NDC: 38101-751-02

## 2020-05-30 ENCOUNTER — Emergency Department (HOSPITAL_COMMUNITY)
Admission: EM | Admit: 2020-05-30 | Discharge: 2020-05-31 | Disposition: A | Discharge status: Home / Self Care | Payer: 59 | Attending: Emergency Medicine | Admitting: Emergency Medicine

## 2020-05-30 ENCOUNTER — Emergency Department (HOSPITAL_COMMUNITY): Payer: 59

## 2020-05-30 ENCOUNTER — Other Ambulatory Visit: Payer: Self-pay

## 2020-05-30 ENCOUNTER — Encounter (HOSPITAL_COMMUNITY): Payer: Self-pay

## 2020-05-30 DIAGNOSIS — M19071 Primary osteoarthritis, right ankle and foot: Secondary | ICD-10-CM | POA: Diagnosis not present

## 2020-05-30 DIAGNOSIS — W228XXA Striking against or struck by other objects, initial encounter: Secondary | ICD-10-CM | POA: Diagnosis not present

## 2020-05-30 DIAGNOSIS — N189 Chronic kidney disease, unspecified: Secondary | ICD-10-CM | POA: Insufficient documentation

## 2020-05-30 DIAGNOSIS — S99921A Unspecified injury of right foot, initial encounter: Secondary | ICD-10-CM | POA: Diagnosis not present

## 2020-05-30 NOTE — ED Triage Notes (Signed)
Pt reports that he was having a couple of alcoholic drinks, kicked the coffee table by accident, now is unable to put weight on right foot. Not wearing shoes. Cap refill < 3 sec. Pedal  Pulse present

## 2020-05-31 ENCOUNTER — Encounter (HOSPITAL_COMMUNITY): Payer: Self-pay | Admitting: Student

## 2020-05-31 DIAGNOSIS — S99921A Unspecified injury of right foot, initial encounter: Secondary | ICD-10-CM | POA: Diagnosis not present

## 2020-05-31 DIAGNOSIS — N189 Chronic kidney disease, unspecified: Secondary | ICD-10-CM | POA: Diagnosis not present

## 2020-05-31 NOTE — ED Provider Notes (Signed)
[facility] EMERGENCY DEPARTMENT Provider Note   CSN: [id number] Arrival date & time: 05/30/20  2250     History Chief Complaint  Patient presents with  . Foot Injury    Thomas Peters is a 35 y.o. male with a history of anxiety, bipolar disorder, PTSD, and chronic kidney disease who presents to the emergency department with complaints of right foot injury that occurred earlier this evening.  Patient states that he had drank some alcohol with his neighbor and subsequently stubbed his toe and then kicked a steel item with the same foot.  He is having pain and swelling to the right foot.  Worse with attempts of movement and weightbearing, no alleviating factors.  No intervention prior to arrival.  He denies numbness, weakness, open wound, or other areas of injury.  HPI     Past Medical History:  Diagnosis Date  . Adopted   . Anxiety   . Bipolar disorder (HCC)   . 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 PTSD (post-traumatic stress disorder)     There are no problems to display for this patient.   Past Surgical History:  Procedure Laterality Date  . EYE SURGERY    . KNEE SURGERY     times 2  . SHOULDER ARTHROSCOPY WITH OPEN ROTATOR CUFF REPAIR Right 06/25/2015   Procedure: SHOULDER ARTHROSCOPY DEBRIDEMENT AND  OPEN SUBSCAP  REPAIR;  Surgeon: Christena Flake, MD;  Location: [facility] ORS;  Service: Orthopedics;  Laterality: Right;  . SHOULDER SURGERY Right   . TONSILLECTOMY         History reviewed. No pertinent family history.  Social History   Tobacco Use  . Smoking status: Never Smoker  . Smokeless tobacco: Never Used  Substance Use Topics  . Alcohol use: Yes    Comment: started drinking heavy again per pt   . Drug use: No    Home Medications Prior to Admission medications   Medication Sig Start Date End Date  Taking? Authorizing Provider  amoxicillin (AMOXIL) 875 MG tablet Take 1 tablet (875 mg total) by mouth 2 (two) times daily. X 10 days 03/04/18   Evon Slack, PA-C  ibuprofen (ADVIL,MOTRIN) 200 MG tablet Take 800 mg by mouth every 6 (six) hours as needed.    [provider]  prazosin (MINIPRESS) 1 MG capsule Take 1 capsule (1 mg total) by mouth at bedtime. 04/23/17 05/23/17  Willy Eddy, MD  sertraline (ZOLOFT) 50 MG tablet Take 1 tablet (50 mg total) by mouth daily. 04/23/17 04/23/18  Willy Eddy, MD    Allergies    Patient has no known allergies.  Review of Systems   Review of Systems  Constitutional: Negative for chills and fever.  Respiratory: Negative for shortness of breath.   Cardiovascular: Negative for chest pain.  Gastrointestinal: Negative for abdominal pain.  Musculoskeletal: Positive for arthralgias and joint swelling.  Skin: Negative for wound.  Neurological: Negative for weakness and numbness.  All other systems reviewed and are negative.   Physical Exam Updated Vital Signs BP 136/81 (BP Location: Right Arm)   Pulse (!) 113   Temp 99.1 F (37.3 C) (Oral)   Resp (!) 21   Ht 5\' 10"  (1.778 m)   Wt (!) 158.8 kg   SpO2 95%   BMI 50.22 kg/m   Physical Exam Vitals and nursing note reviewed.  Constitutional:      General: He is not in acute distress.    Appearance: He is not ill-appearing or toxic-appearing.  HENT:     Head: Normocephalic and atraumatic.  Cardiovascular:     Pulses:          Dorsalis pedis pulses are 2+ on the right side and 2+ on the left side.       Posterior tibial pulses are 2+ on the right side and 2+ on the left side.  Pulmonary:     Effort: Pulmonary effort is normal.  Musculoskeletal:     Comments: Lower extremities: Patient has soft tissue swelling to the digits especially over the great toe as well as to the mid and forefoot of the right foot.  There are no significant open wounds.  No active bleeding noted.   Limited range of motion of the right foot digits.  Full intact active range of motion of the ankles and knees bilaterally.  Patient is tender throughout the right midfoot, forefoot, and all digits.  Lower extremities are otherwise nontender.  Compartments are soft.  Skin:    General: Skin is warm and dry.     Capillary Refill: Capillary refill takes less than 2 seconds.  Neurological:     Mental Status: He is alert.     Comments: Alert. Clear speech. Sensation grossly intact to bilateral lower extremities. 5/5 strength with plantar/dorsiflexion bilaterally.  Psychiatric:        Mood and Affect: Mood normal.        Behavior: Behavior normal.     ED Results / Procedures / Treatments   Labs (all labs ordered are listed, but only abnormal results are displayed) Labs Reviewed - No data to display  EKG None  Radiology DG Foot Complete Right  Result Date: 05/30/2020 CLINICAL DATA:  Trauma, inability to bear weight on right foot EXAM: RIGHT FOOT COMPLETE - 3+ VIEW COMPARISON:  None. FINDINGS: Frontal, oblique, and lateral views of the right foot are obtained. No acute displaced fracture, subluxation, or dislocation. 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 Soft tissues are unremarkable. IMPRESSION: 1. Mild osteoarthritis of the first digit. No acute displaced fracture. Electronically Signed   By: Sharlet Salina M.D.   On: 05/30/2020 23:50    Procedures Procedures   Medications Ordered in ED Medications - No data to display  ED Course  I have reviewed the triage vital signs and the nursing notes.  Pertinent labs & imaging results that were available during my care of the patient were reviewed by me and considered in my medical decision making (see chart for details).    MDM Rules/Calculators/A&P                         Patient presents to the ED with complaints of pain to the  Right foot s/p injury. Exam without obvious deformity or open wounds.  Soft tissue swelling & diffuse tenderness noted . NVI distally. Xray negative for fracture/dislocation. Therapeutic post op shoe provided, crutches,. PRICE and orthopedics follow up. I discussed results, treatment plan, need for follow-up, and return precautions with the patient. Provided opportunity for questions, patient confirmed understanding and are in agreement with plan.   Final Clinical Impression(s) / ED Diagnoses Final diagnoses:  Injury of right foot, initial encounter    Rx / DC Orders ED Discharge Orders    None       Cherly Anderson, PA-C 05/31/20 0210  Maia Plan, MD 06/02/20 [phone]

## 2020-05-31 NOTE — ED Notes (Signed)
Waiting on ortho tech. 

## 2020-05-31 NOTE — Discharge Instructions (Signed)
Please read and follow all provided instructions.  You have been seen today for an injury to your right foot.   Tests performed today include: An x-ray of the affected area - does NOT show any broken bones or dislocations.  Vital signs. See below for your results today.   Home care instructions: -- *PRICE in the first 24-48 hours after injury: Protect (with brace, splint, sling), if given by your provider Rest Ice- Do not apply ice pack directly to your skin, place towel or similar between your skin and ice/ice pack. Apply ice for 20 min, then remove for 40 min while awake Compression- Wear brace, elastic bandage, splint as directed by your provider Elevate affected extremity above the level of your heart when not walking around for the first 24-48 hours    Follow-up instructions: Please follow-up with your primary care provider or the provided orthopedic physician (bone specialist) if you continue to have significant pain in 1 week. In this case you may have a more severe injury that requires further care.   Return instructions:  Please return if your digits or extremity are numb or tingling, appear gray or blue, or you have severe pain (also elevate the extremity and loosen splint or wrap if you were given one) Please return if you have redness or fevers.  Please return to the Emergency Department if you experience worsening symptoms.  Please return if you have any other emergent concerns. Additional Information:  Your vital signs today were: BP 136/81 (BP Location: Right Arm)   Pulse (!) 113   Temp 99.1 F (37.3 C) (Oral)   Resp (!) 21   Ht 5\' 10"  (1.778 m)   Wt (!) 158.8 kg   SpO2 95%   BMI 50.22 kg/m  If your blood pressure (BP) was elevated above 135/85 this visit, please have this repeated by your doctor within one month. ---------------

## 2020-05-31 NOTE — Progress Notes (Signed)
Orthopedic Tech Progress Note Patient Details:  Thomas Peters [birth year] [id number]  Ortho Devices Type of Ortho Device: Crutches,Postop shoe/boot Ortho Device/Splint Location: rle Ortho Device/Splint Interventions: Ordered,Application,Adjustment   Post Interventions Patient Tolerated: Well Instructions Provided: Care of device,Adjustment of device   Trinna Post 05/31/2020, 3:04 AM

## 2021-04-27 ENCOUNTER — Other Ambulatory Visit: Payer: Self-pay

## 2021-04-27 ENCOUNTER — Emergency Department (HOSPITAL_BASED_OUTPATIENT_CLINIC_OR_DEPARTMENT_OTHER)
Admission: EM | Admit: 2021-04-27 | Discharge: 2021-04-28 | Disposition: A | Discharge status: Home / Self Care | Payer: No Typology Code available for payment source | Attending: Emergency Medicine | Admitting: Emergency Medicine

## 2021-04-27 ENCOUNTER — Emergency Department (HOSPITAL_BASED_OUTPATIENT_CLINIC_OR_DEPARTMENT_OTHER): Payer: No Typology Code available for payment source

## 2021-04-27 ENCOUNTER — Encounter (HOSPITAL_BASED_OUTPATIENT_CLINIC_OR_DEPARTMENT_OTHER): Payer: Self-pay

## 2021-04-27 DIAGNOSIS — R519 Headache, unspecified: Secondary | ICD-10-CM | POA: Diagnosis not present

## 2021-04-27 DIAGNOSIS — H53132 Sudden visual loss, left eye: Secondary | ICD-10-CM | POA: Insufficient documentation

## 2021-04-27 DIAGNOSIS — Z0282 Encounter for adoption services: Secondary | ICD-10-CM

## 2021-04-27 LAB — CBC WITH DIFFERENTIAL/PLATELET
Abs Immature Granulocytes: 0.05 10*3/uL (ref 0.00–0.07)
Basophils Absolute: 0.1 10*3/uL (ref 0.0–0.1)
Basophils Relative: 1 %
Eosinophils Absolute: 0.2 10*3/uL (ref 0.0–0.5)
Eosinophils Relative: 3 %
HCT: 43.3 % (ref 39.0–52.0)
Hemoglobin: 14.6 g/dL (ref 13.0–17.0)
Immature Granulocytes: 1 %
Lymphocytes Relative: 32 %
Lymphs Abs: 2.8 10*3/uL (ref 0.7–4.0)
MCH: 31.9 pg (ref 26.0–34.0)
MCHC: 33.7 g/dL (ref 30.0–36.0)
MCV: 94.7 fL (ref 80.0–100.0)
Monocytes Absolute: 0.9 10*3/uL (ref 0.1–1.0)
Monocytes Relative: 11 %
Neutro Abs: 4.7 10*3/uL (ref 1.7–7.7)
Neutrophils Relative %: 52 %
Platelets: 226 10*3/uL (ref 150–400)
RBC: 4.57 MIL/uL (ref 4.22–5.81)
RDW: 13.4 % (ref 11.5–15.5)
WBC: 8.8 10*3/uL (ref 4.0–10.5)
nRBC: 0 % (ref 0.0–0.2)

## 2021-04-27 LAB — BASIC METABOLIC PANEL
Anion gap: 7 (ref 5–15)
BUN: 12 mg/dL (ref 6–20)
CO2: 27 mmol/L (ref 22–32)
Calcium: 9 mg/dL (ref 8.9–10.3)
Chloride: 106 mmol/L (ref 98–111)
Creatinine, Ser: 1.01 mg/dL (ref 0.61–1.24)
GFR, Estimated: >60 mL/min (ref >60)
Glucose, Bld: 87 mg/dL (ref 70–99)
Potassium: 3.8 mmol/L (ref 3.5–5.1)
Sodium: 140 mmol/L (ref 135–145)

## 2021-04-27 MED ORDER — LACTATED RINGERS IV BOLUS
1000.0000 mL | Freq: Once | INTRAVENOUS | Status: AC
  Administered 2021-04-27: 1000 mL via INTRAVENOUS

## 2021-04-27 MED ORDER — IOHEXOL 350 MG/ML SOLN
75.0000 mL | Freq: Once | INTRAVENOUS | Status: AC | PRN
Start: 2021-04-27 — End: 2021-04-27
  Administered 2021-04-27: 75 mL via INTRAVENOUS

## 2021-04-27 MED ORDER — DIPHENHYDRAMINE HCL 50 MG/ML IJ SOLN
25.0000 mg | Freq: Once | INTRAMUSCULAR | Status: AC
  Administered 2021-04-27: 25 mg via INTRAVENOUS
  Filled 2021-04-27: qty 1

## 2021-04-27 MED ORDER — PROCHLORPERAZINE EDISYLATE 10 MG/2ML IJ SOLN
10.0000 mg | Freq: Once | INTRAMUSCULAR | Status: AC
  Administered 2021-04-27: 10 mg via INTRAVENOUS
  Filled 2021-04-27: qty 2

## 2021-04-27 NOTE — ED Triage Notes (Signed)
Patient here POV from Home with headache.  Patient states yesterday at approximately 1130 he had an acute onset of Sudden and Complete Vision Loss to Left Eye. Since, the Patient has had a Anterior Headache. Also endorses Photosensitivity.   Vision has completely returned. No Fevers. No N/V/D.   NAD Noted during Triage. A&Ox4. GCS.

## 2021-04-27 NOTE — ED Provider Notes (Signed)
[facility] EMERGENCY DEPT  Provider Note  CSN: [id number] Arrival date & time: 04/27/21 1910  History Chief Complaint  Patient presents with   Headache    Thomas Peters is a 36 y.o. male with no prior history of headaches reports yesterday around 11:30am he had sudden loss of vision of his left eye while at work. About an hour later he had gradual onset of severe, sharp L sided headache. The vision loss resolved about an hour after that but the headache has persisted. He has had photophobia and nausea with it. Not improved with home meds. He has never had a headache like that before. 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    Home Medications Prior to Admission medications   Medication Sig Start Date End Date Taking? Authorizing Provider  amoxicillin (AMOXIL) 875 MG tablet Take 1 tablet (875 mg total) by mouth 2 (two) times daily. X 10 days 03/04/18   Duanne Guess, PA-C  ibuprofen (ADVIL,MOTRIN) 200 MG tablet Take 800 mg by mouth every 6 (six) hours as needed.    [provider]  prazosin (MINIPRESS) 1 MG capsule Take 1 capsule (1 mg total) by mouth at bedtime. 04/23/17 05/23/17  Merlyn Lot, MD  sertraline (ZOLOFT) 50 MG tablet Take 1 tablet (50 mg total) by mouth daily. 04/23/17 04/23/18  Merlyn Lot, MD     Allergies    Patient has no known allergies.   Review of Systems   Review of Systems Please see HPI for pertinent positives and negatives  Physical Exam BP 116/68    Pulse 70    Temp 98.4 F (36.9 C)    Resp 18    Ht 5\' 10"  (1.778 m)    Wt (!) 158.8 kg    SpO2 100%    BMI 50.23 kg/m   Physical Exam Vitals and nursing note reviewed.  Constitutional:      Appearance: Normal appearance.  HENT:     Head: Normocephalic and atraumatic.     Nose: Nose normal.     Mouth/Throat:     Mouth: Mucous membranes are moist.  Eyes:     General: No visual field  deficit.    Extraocular Movements: Extraocular movements intact.     Conjunctiva/sclera: Conjunctivae normal.  Cardiovascular:     Rate and Rhythm: Normal rate.  Pulmonary:     Effort: Pulmonary effort is normal.     Breath sounds: Normal breath sounds.  Abdominal:     General: Abdomen is flat.     Palpations: Abdomen is soft.     Tenderness: There is no abdominal tenderness.  Musculoskeletal:        General: No swelling. Normal range of motion.     Cervical back: Neck supple.  Skin:    General: Skin is warm and dry.  Neurological:     General: No focal deficit present.     Mental Status: He is alert and oriented to person, place, and time.     Cranial Nerves: No cranial nerve deficit or dysarthria.     Sensory: No sensory deficit.     Motor: No weakness.     Gait: Gait normal.  Psychiatric:        Mood and Affect: Mood normal.    ED Results / Procedures / Treatments   EKG None  Procedures Procedures  Medications Ordered in the ED Medications  lactated ringers  bolus 1,000 mL (1,000 mLs Intravenous New Bag/Given 04/27/21 2247)  prochlorperazine (COMPAZINE) injection 10 mg (10 mg Intravenous Given 04/27/21 2243)  diphenhydrAMINE (BENADRYL) injection 25 mg (25 mg Intravenous Given 04/27/21 2240)    Initial Impression and Plan  Patient well appearing with persistent headache since yesterday, initially associated with vision loss but now only having headache and photophobia. Normal neuro exam. Will check labs (he says he only has one kidney) and send for CTA. IVF, compazine/benadryl for headache.   ED Course   Clinical Course as of 04/27/21 2316  Tue Apr 27, 2021  2248 CBC is normal.  [CS]  2309 BMP is normal.  [CS]  [id number] Care of the patient signed out to Dr. Florina Ou at the change of shift pending CTA.  [CS]    Clinical Course User Index [CS] Truddie Hidden, MD     MDM Rules/Calculators/A&P Medical Decision Making Problems Addressed: Acute nonintractable  headache, unspecified headache type: acute illness or injury  Amount and/or Complexity of Data Reviewed Labs: ordered. Decision-making details documented in ED Course. Radiology: ordered.  Risk Prescription drug management.    Final Clinical Impression(s) / ED Diagnoses Final diagnoses:  Acute nonintractable headache, unspecified headache type    Rx / DC Orders ED Discharge Orders     None        Truddie Hidden, MD 04/27/21 2316

## 2021-04-28 NOTE — ED Provider Notes (Signed)
12:17 AM Headache resolved, patient resting peacefully.  Patient advised of reassuring head CT with no evidence of intracranial hemorrhage or vascular abnormality.  I suspect symptoms were due to a complex migraine.  Nursing notes and vitals signs, including pulse oximetry, reviewed.  Summary of this visit's results, reviewed by myself:  EKG:  EKG Interpretation  Date/Time:    Ventricular Rate:    PR Interval:    QRS Duration:   QT Interval:    QTC Calculation:   R Axis:     Text Interpretation:          Labs:  Results for orders placed or performed during the hospital encounter of 04/27/21 (from the past 24 hour(s))  Basic metabolic panel     Status: None   Collection Time: 04/27/21 10:35 PM  Result Value Ref Range   Sodium 140 135 - 145 mmol/L   Potassium 3.8 3.5 - 5.1 mmol/L   Chloride 106 98 - 111 mmol/L   CO2 27 22 - 32 mmol/L   Glucose, Bld 87 70 - 99 mg/dL   BUN 12 6 - 20 mg/dL   Creatinine, Ser 2.19 0.61 - 1.24 mg/dL   Calcium 9.0 8.9 - 75.8 mg/dL   GFR, Estimated >83 >25 mL/min   Anion gap 7 5 - 15  CBC with Differential     Status: None   Collection Time: 04/27/21 10:35 PM  Result Value Ref Range   WBC 8.8 4.0 - 10.5 K/uL   RBC 4.57 4.22 - 5.81 MIL/uL   Hemoglobin 14.6 13.0 - 17.0 g/dL   HCT 49.8 26.4 - 15.8 %   MCV 94.7 80.0 - 100.0 fL   MCH 31.9 26.0 - 34.0 pg   MCHC 33.7 30.0 - 36.0 g/dL   RDW 30.9 40.7 - 68.0 %   Platelets 226 150 - 400 K/uL   nRBC 0.0 0.0 - 0.2 %   Neutrophils Relative % 52 %   Neutro Abs 4.7 1.7 - 7.7 K/uL   Lymphocytes Relative 32 %   Lymphs Abs 2.8 0.7 - 4.0 K/uL   Monocytes Relative 11 %   Monocytes Absolute 0.9 0.1 - 1.0 K/uL   Eosinophils Relative 3 %   Eosinophils Absolute 0.2 0.0 - 0.5 K/uL   Basophils Relative 1 %   Basophils Absolute 0.1 0.0 - 0.1 K/uL   Immature Granulocytes 1 %   Abs Immature Granulocytes 0.05 0.00 - 0.07 K/uL    Imaging Studies: CT Angio Head W/Cm &/Or Wo Cm  Result Date:  04/28/2021 CLINICAL DATA:  Headache, sudden, severe, vision loss EXAM: CT ANGIOGRAPHY HEAD TECHNIQUE: Multidetector CT imaging of the head was performed using the standard protocol during bolus administration of intravenous contrast. Multiplanar CT image reconstructions and MIPs were obtained to evaluate the vascular anatomy. RADIATION DOSE REDUCTION: This exam was performed according to the departmental dose-optimization program which includes automated exposure control, adjustment of the mA and/or kV according to patient size and/or use of iterative reconstruction technique. CONTRAST:  58mL OMNIPAQUE IOHEXOL 350 MG/ML SOLN COMPARISON:  None. FINDINGS: CT HEAD Brain: No evidence of acute infarction, hemorrhage, cerebral edema, mass, mass effect, or midline shift. No hydrocephalus or extra-axial fluid collection. Vascular: No hyperdense vessel. Skull: Normal. Negative for fracture or focal lesion. Sinuses/Orbits: No acute finding. Other: The mastoid air cells are well aerated. CTA HEAD Anterior circulation: Both internal carotid arteries are patent to the termini, without significant stenosis. A1 segments patent. Normal anterior communicating artery. Anterior cerebral arteries are patent  to their distal aspects. No M1 stenosis or occlusion. Normal MCA bifurcations. Distal MCA branches perfused and symmetric. Posterior circulation: Vertebral arteries patent to the vertebrobasilar junction without stenosis. Posterior inferior cerebral arteries patent bilaterally. Basilar patent to its distal aspect. Superior cerebellar arteries patent bilaterally. Patent P1 segments. PCAs perfused to their distal aspects without stenosis. The bilateral posterior communicating arteries are not visualized. Venous sinuses: As permitted by contrast timing, patent. Anatomic variants: None significant Review of the MIP images confirms the above findings. IMPRESSION: 1.  No acute intracranial process. 2.  No intracranial large vessel  occlusion or significant stenosis. Electronically Signed   By: Wiliam Ke M.D.   On: 04/28/2021 00:11      Kosisochukwu Burningham, Jonny Ruiz, MD 04/28/21 [phone]

## 2021-04-28 NOTE — ED Provider Notes (Signed)
Nursing notes and vitals signs, including pulse oximetry, reviewed.  Summary of this visit's results, reviewed by myself:  EKG:  EKG Interpretation  Date/Time:    Ventricular Rate:    PR Interval:    QRS Duration:   QT Interval:    QTC Calculation:   R Axis:     Text Interpretation:          Labs:  Results for orders placed or performed during the hospital encounter of 04/27/21 (from the past 24 hour(s))  Basic metabolic panel     Status: None   Collection Time: 04/27/21 10:35 PM  Result Value Ref Range   Sodium 140 135 - 145 mmol/L   Potassium 3.8 3.5 - 5.1 mmol/L   Chloride 106 98 - 111 mmol/L   CO2 27 22 - 32 mmol/L   Glucose, Bld 87 70 - 99 mg/dL   BUN 12 6 - 20 mg/dL   Creatinine, Ser 8.56 0.61 - 1.24 mg/dL   Calcium 9.0 8.9 - 31.4 mg/dL   GFR, Estimated >97 >02 mL/min   Anion gap 7 5 - 15  CBC with Differential     Status: None   Collection Time: 04/27/21 10:35 PM  Result Value Ref Range   WBC 8.8 4.0 - 10.5 K/uL   RBC 4.57 4.22 - 5.81 MIL/uL   Hemoglobin 14.6 13.0 - 17.0 g/dL   HCT 63.7 85.8 - 85.0 %   MCV 94.7 80.0 - 100.0 fL   MCH 31.9 26.0 - 34.0 pg   MCHC 33.7 30.0 - 36.0 g/dL   RDW 27.7 41.2 - 87.8 %   Platelets 226 150 - 400 K/uL   nRBC 0.0 0.0 - 0.2 %   Neutrophils Relative % 52 %   Neutro Abs 4.7 1.7 - 7.7 K/uL   Lymphocytes Relative 32 %   Lymphs Abs 2.8 0.7 - 4.0 K/uL   Monocytes Relative 11 %   Monocytes Absolute 0.9 0.1 - 1.0 K/uL   Eosinophils Relative 3 %   Eosinophils Absolute 0.2 0.0 - 0.5 K/uL   Basophils Relative 1 %   Basophils Absolute 0.1 0.0 - 0.1 K/uL   Immature Granulocytes 1 %   Abs Immature Granulocytes 0.05 0.00 - 0.07 K/uL    Imaging Studies: CT Angio Head W/Cm &/Or Wo Cm  Result Date: 04/28/2021 CLINICAL DATA:  Headache, sudden, severe, vision loss EXAM: CT ANGIOGRAPHY HEAD TECHNIQUE: Multidetector CT imaging of the head was performed using the standard protocol during bolus administration of intravenous contrast.  Multiplanar CT image reconstructions and MIPs were obtained to evaluate the vascular anatomy. RADIATION DOSE REDUCTION: This exam was performed according to the departmental dose-optimization program which includes automated exposure control, adjustment of the mA and/or kV according to patient size and/or use of iterative reconstruction technique. CONTRAST:  49mL OMNIPAQUE IOHEXOL 350 MG/ML SOLN COMPARISON:  None. FINDINGS: CT HEAD Brain: No evidence of acute infarction, hemorrhage, cerebral edema, mass, mass effect, or midline shift. No hydrocephalus or extra-axial fluid collection. Vascular: No hyperdense vessel. Skull: Normal. Negative for fracture or focal lesion. Sinuses/Orbits: No acute finding. Other: The mastoid air cells are well aerated. CTA HEAD Anterior circulation: Both internal carotid arteries are patent to the termini, without significant stenosis. A1 segments patent. Normal anterior communicating artery. Anterior cerebral arteries are patent to their distal aspects. No M1 stenosis or occlusion. Normal MCA bifurcations. Distal MCA branches perfused and symmetric. Posterior circulation: Vertebral arteries patent to the vertebrobasilar junction without stenosis. Posterior inferior cerebral arteries patent  bilaterally. Basilar patent to its distal aspect. Superior cerebellar arteries patent bilaterally. Patent P1 segments. PCAs perfused to their distal aspects without stenosis. The bilateral posterior communicating arteries are not visualized. Venous sinuses: As permitted by contrast timing, patent. Anatomic variants: None significant Review of the MIP images confirms the above findings. IMPRESSION: 1.  No acute intracranial process. 2.  No intracranial large vessel occlusion or significant stenosis. Electronically Signed   By: Wiliam Ke M.D.   On: 04/28/2021 00:11       Allen Basista, Jonny Ruiz, MD 04/28/21 [phone]

## 2023-04-15 ENCOUNTER — Other Ambulatory Visit: Payer: Self-pay

## 2023-04-15 ENCOUNTER — Emergency Department (HOSPITAL_BASED_OUTPATIENT_CLINIC_OR_DEPARTMENT_OTHER)
Admission: EM | Admit: 2023-04-15 | Discharge: 2023-04-15 | Disposition: A | Discharge status: Home / Self Care | Payer: Managed Care, Other (non HMO) | Attending: Emergency Medicine | Admitting: Emergency Medicine

## 2023-04-15 DIAGNOSIS — K0889 Other specified disorders of teeth and supporting structures: Secondary | ICD-10-CM | POA: Insufficient documentation

## 2023-04-15 DIAGNOSIS — N189 Chronic kidney disease, unspecified: Secondary | ICD-10-CM | POA: Diagnosis not present

## 2023-04-15 MED ORDER — AMOXICILLIN 500 MG PO CAPS: 500.0000 mg | ORAL_CAPSULE | Freq: Three times a day (TID) | ORAL | 0 refills | Status: AC

## 2023-04-15 MED ORDER — AMOXICILLIN 500 MG PO CAPS
500.0000 mg | ORAL_CAPSULE | Freq: Once | ORAL | Status: AC
  Administered 2023-04-15: 500 mg via ORAL
  Filled 2023-04-15: qty 1

## 2023-04-15 MED ORDER — OXYCODONE HCL 5 MG PO TABS
5.0000 mg | ORAL_TABLET | Freq: Once | ORAL | Status: AC
  Administered 2023-04-15: 5 mg via ORAL
  Filled 2023-04-15: qty 1

## 2023-04-15 MED ORDER — NAPROXEN 250 MG PO TABS
500.0000 mg | ORAL_TABLET | Freq: Once | ORAL | Status: AC
  Administered 2023-04-15: 500 mg via ORAL
  Filled 2023-04-15: qty 2

## 2023-04-15 MED ORDER — NAPROXEN 500 MG PO TABS: 500.0000 mg | ORAL_TABLET | Freq: Two times a day (BID) | ORAL | 0 refills | Status: AC

## 2023-04-15 NOTE — ED Triage Notes (Addendum)
 Pt POV reporting R side mouth swelling, used toothpick to get food from between teeth and filling came off, now having severe pain and swelling.

## 2023-04-15 NOTE — Discharge Instructions (Signed)
 You were evaluated in the Emergency Department and after careful evaluation, we did not find any emergent condition requiring admission or further testing in the hospital.  Your exam/testing today is overall reassuring.  Symptoms may be due to a tooth infection.  Recommend follow-up with your dentist to discuss her symptoms.  Take the amoxicillin  antibiotic as directed, use the Naprosyn  twice daily for pain.  Please return to the Emergency Department if you experience any worsening of your condition.   Thank you for allowing us  to be a part of your care.

## 2023-04-15 NOTE — ED Provider Notes (Signed)
 [facility] Emergency Department Provider Note MRN:  [id number]  Arrival date & time: 04/15/23     Chief Complaint   Dental pain History of Present Illness   Thomas Peters is a 38 y.o. year-old male with no pertinent past medical history presenting to the ED with chief complaint of dental pain.  Pain surrounding the area of tooth #27 and 28, worsening over the past 2 days.  Today was trying to get some food out of his mouth with a toothpick and his filling fell out in this area.  Denies fever.  Review of Systems  A thorough review of systems was obtained and all systems are negative except as noted in the HPI and PMH.   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  Social History   Socioeconomic History   Marital status: Single    Spouse name: Not on file   Number of children: Not on file   Years of education: Not on file   Highest education level: Not on file  Occupational History   Not on file  Tobacco Use   Smoking status: Never   Smokeless tobacco: Never  Substance and Sexual Activity   Alcohol use: Yes    Comment: started drinking heavy again per pt    Drug use: No   Sexual activity: Not on file  Other Topics Concern   Not on file  Social History  Narrative   Not on file   Social Drivers of Health   Financial Resource Strain: Not on file  Food Insecurity: Not on file  Transportation Needs: Not on file  Physical Activity: Not on file  Stress: Not on file  Social Connections: Not on file  Intimate Partner Violence: Not on file     Physical Exam   Vitals:   04/15/23 0116  BP: (!) 139/99  Pulse: 92  Resp: 16  Temp: 98.1 F (36.7 C)  SpO2: 99%    CONSTITUTIONAL: Well-appearing, NAD NEURO/PSYCH:  Alert and oriented x 3, no focal deficits EYES:  eyes equal and reactive ENT/NECK:  no LAD, no JVD CARDIO: Regular rate, well-perfused, normal S1 and S2 PULM:  CTAB no wheezing or rhonchi GI/GU:  non-distended, non-tender MSK/SPINE:  No gross deformities, no edema SKIN:  no rash, atraumatic   *Additional and/or pertinent findings included in MDM below  Diagnostic and Interventional Summary    EKG Interpretation Date/Time:    Ventricular Rate:    PR Interval:    QRS Duration:    QT Interval:    QTC Calculation:   R Axis:      Text Interpretation:         Labs Reviewed -  No data to display  No orders to display    Medications  amoxicillin  (AMOXIL ) capsule 500 mg (has no administration in time range)  naproxen  (NAPROSYN ) tablet 500 mg (has no administration in time range)  oxyCODONE  (Oxy IR/ROXICODONE ) immediate release tablet 5 mg (has no administration in time range)     Procedures  /  Critical Care Procedures  ED Course and Medical Decision Making  Initial Impression and Ddx No signs of abscess, no systemic symptoms to suggest more significant infection.  Suspect tooth infection.  There is some tooth decay to the area but otherwise nothing to suggest more significant contiguous infection.  Past medical/surgical history that increases complexity of ED encounter: None  Interpretation of Diagnostics Laboratory and/or imaging options to aid in the diagnosis/care of the patient were considered.  After  careful history and physical examination, it was determined that there was no indication for diagnostics at this time.  Patient Reassessment and Ultimate Disposition/Management     Discharge  Patient management required discussion with the following services or consulting groups:  None  Complexity of Problems Addressed Acute complicated illness or Injury  Additional Data Reviewed and Analyzed Further history obtained from: Further history from spouse/family member  Additional Factors Impacting ED Encounter Risk Prescriptions  Ozell HERO. Theadore, MD [facility] Emergency Medicine [facility] [email or web address]  Final Clinical Impressions(s) / ED Diagnoses     ICD-10-CM   1. Tooth pain  K08.89       ED Discharge Orders          Ordered    amoxicillin  (AMOXIL ) 500 MG capsule  3 times daily        04/15/23 0424    naproxen  (NAPROSYN ) 500 MG tablet  2 times daily        04/15/23 0424             Discharge Instructions Discussed with and Provided to Patient:    Discharge Instructions      You were evaluated in the Emergency Department and after careful evaluation, we did not find any emergent condition requiring admission or further testing in the hospital.  Your exam/testing today is overall reassuring.  Symptoms may be due to a tooth infection.  Recommend follow-up with your dentist to discuss her symptoms.  Take the amoxicillin  antibiotic as directed, use the Naprosyn  twice daily for pain.  Please return to the Emergency Department if you experience any worsening of your condition.   Thank you for allowing us  to be a part of your care.      Theadore Ozell HERO, MD 04/15/23 [phone]

## 2023-10-11 IMAGING — CT CT ANGIO HEAD
3 of 11 series · 15 of 47 positions shown · non-contrast
Comparison: None.

CLINICAL DATA: Headache, sudden, severe, vision loss

EXAM:
CT ANGIOGRAPHY HEAD
TECHNIQUE: Multidetector CT imaging of the head was performed using the
standard protocol during bolus administration of intravenous
contrast. Multiplanar CT image reconstructions and MIPs were
obtained to evaluate the vascular anatomy.

[Series 13: ax thin · axial · 0.37mm/px · z∈[-237,-94]mm · 9 of 169 slices shown]
[im 13/169  brain]
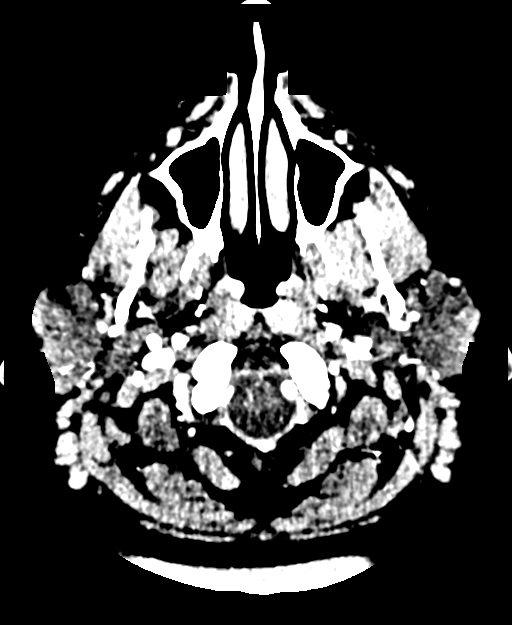
[im 39/169  bone]
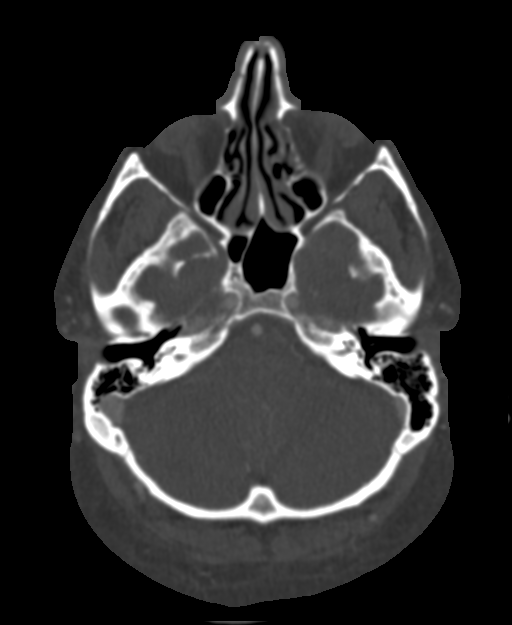
[im 52/169  brain]
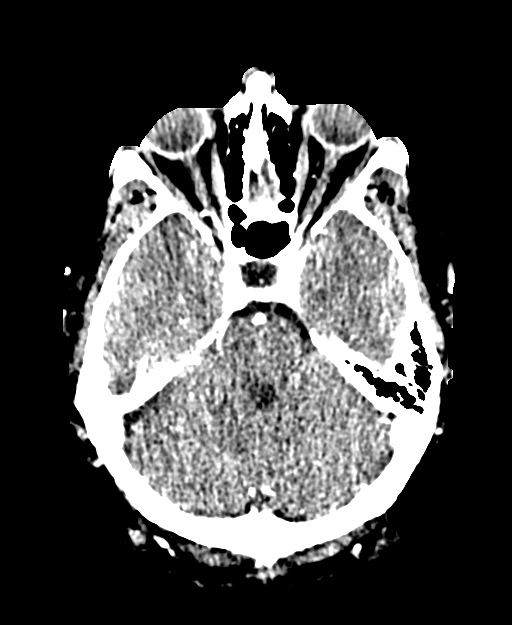
[im 65/169  bone]
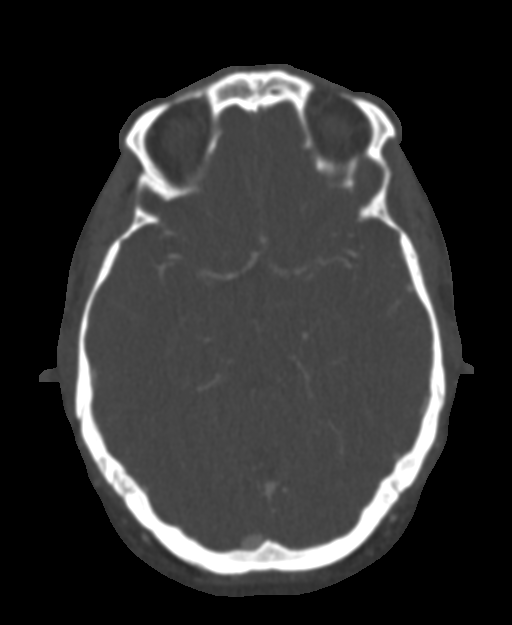
[im 91/169  brain]
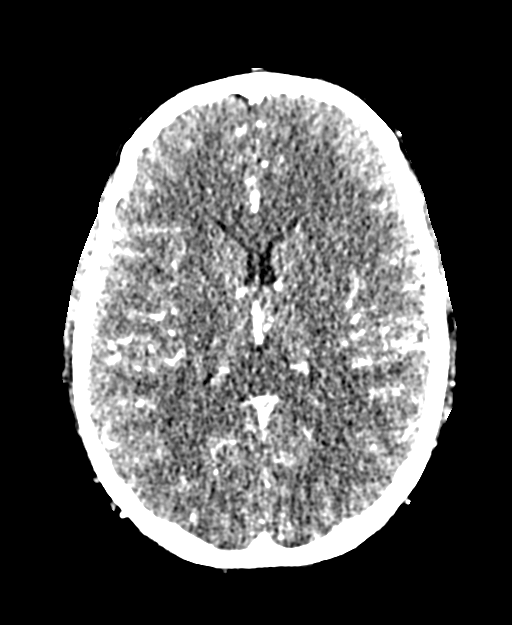
[im 104/169  bone]
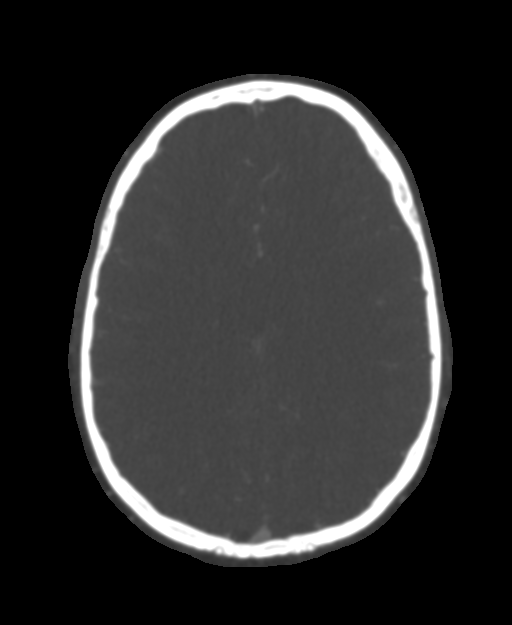
[im 117/169  brain]
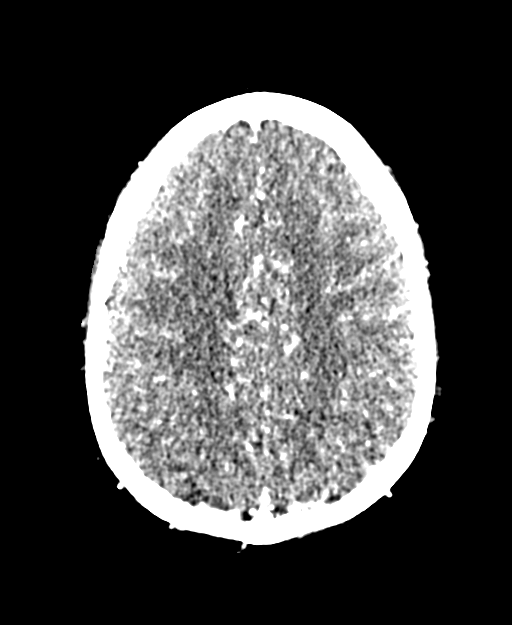
[im 143/169  bone]
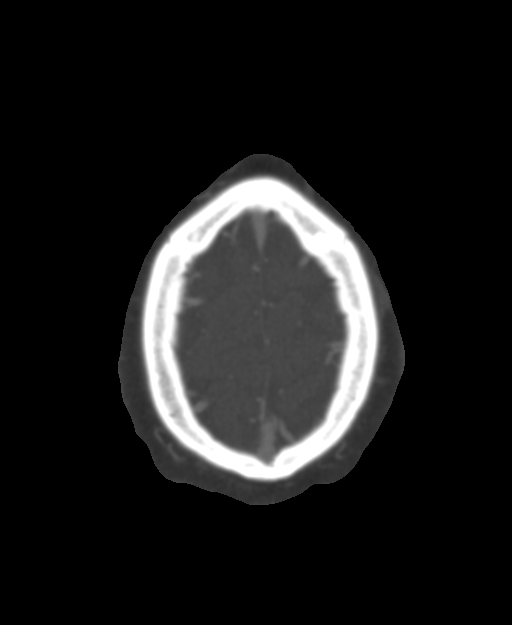
[im 156/169  brain]
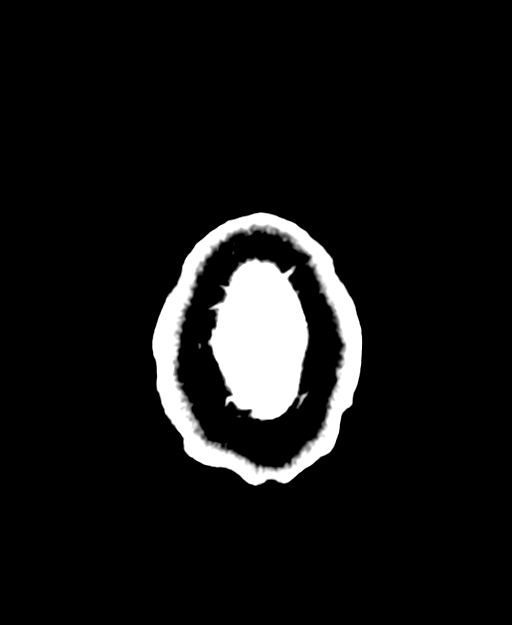

[Series 17: cor thin · coronal · 0.35mm/px · 3 of 245 slices shown]
[im 82/245  brain]
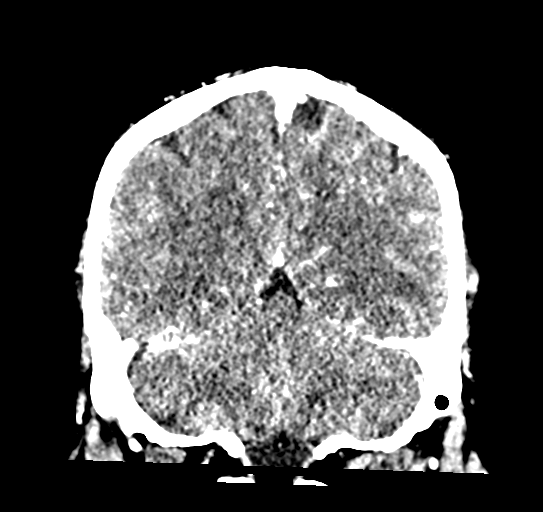
[im 123/245  brain]
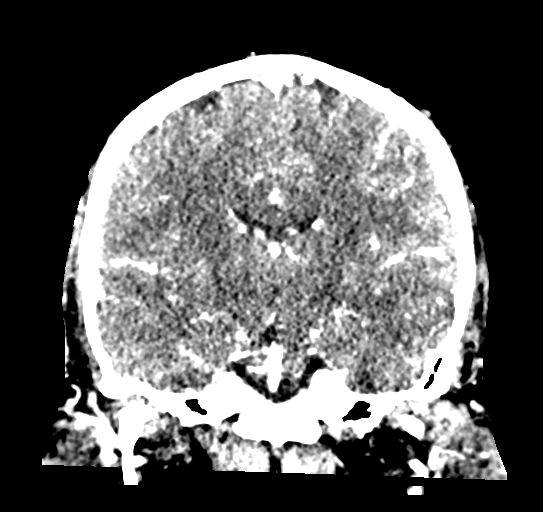
[im 163/245  brain]
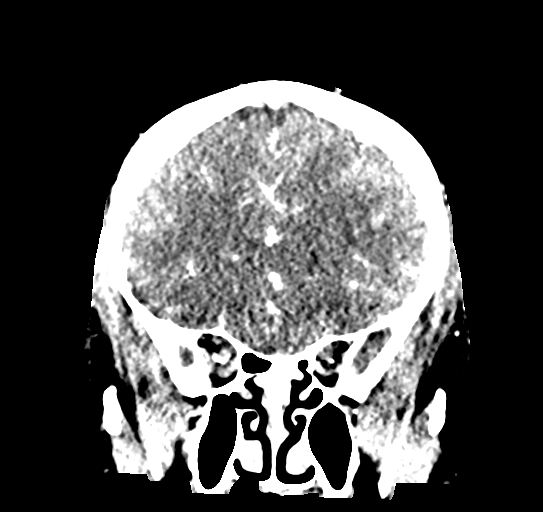

[Series 19: sag thin · sagittal · 0.35mm/px · 3 of 192 slices shown]
[im 48/192  brain]
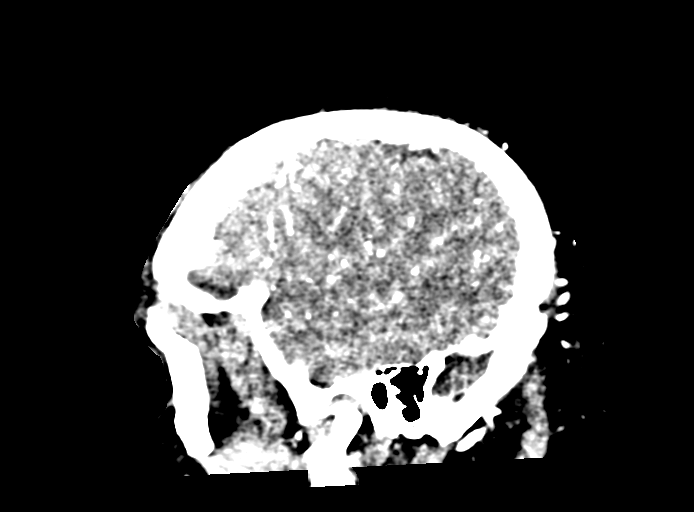
[im 96/192  brain]
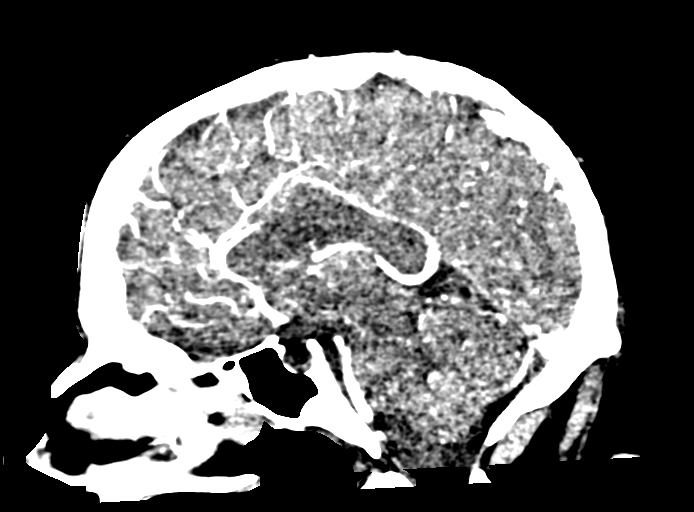
[im 144/192  brain]
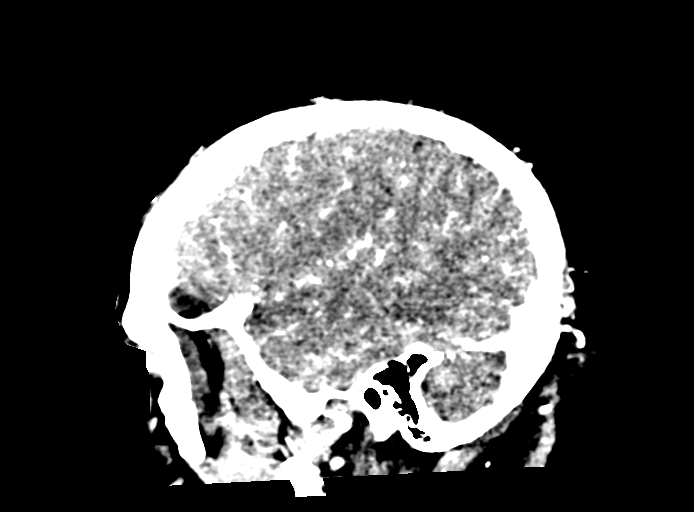

[15 of 47 positions shown; findings below may reference images not displayed]

RADIATION DOSE REDUCTION: This exam was performed according to the
departmental dose-optimization program which includes automated
exposure control, adjustment of the mA and/or kV according to
patient size and/or use of iterative reconstruction technique.

CONTRAST:  75mL OMNIPAQUE IOHEXOL 350 MG/ML SOLN
FINDINGS: CT HEAD

Brain: No evidence of acute infarction, hemorrhage, cerebral edema,
mass, mass effect, or midline shift. No hydrocephalus or extra-axial
fluid collection.

Vascular: No hyperdense vessel.

Skull: Normal. Negative for fracture or focal lesion.

Sinuses/Orbits: No acute finding.

Other: The mastoid air cells are well aerated.

CTA HEAD

Anterior circulation: Both internal carotid arteries are patent to
the termini, without significant stenosis.

A1 segments patent. Normal anterior communicating artery. Anterior
cerebral arteries are patent to their distal aspects.

No M1 stenosis or occlusion. Normal MCA bifurcations. Distal MCA
branches perfused and symmetric.

Posterior circulation: Vertebral arteries patent to the
vertebrobasilar junction without stenosis. Posterior inferior
cerebral arteries patent bilaterally.

Basilar patent to its distal aspect. Superior cerebellar arteries
patent bilaterally.

Patent P1 segments. PCAs perfused to their distal aspects without
stenosis. The bilateral posterior communicating arteries are not
visualized.

Venous sinuses: As permitted by contrast timing, patent.

Anatomic variants: None significant

Review of the MIP images confirms the above findings.
IMPRESSION: 1.  No acute intracranial process.
2.  No intracranial large vessel occlusion or significant stenosis.
